# Patient Record
Sex: Male | Born: 1960 | Hispanic: No | Marital: Married | Smoking: Never smoker
Health system: Southern US, Community
[De-identification: ages and names within clinical notes are randomized; demographics above are authoritative.]

## PROBLEM LIST (undated history)

## (undated) DIAGNOSIS — N400 Enlarged prostate without lower urinary tract symptoms: Secondary | ICD-10-CM

## (undated) DIAGNOSIS — F419 Anxiety disorder, unspecified: Secondary | ICD-10-CM

## (undated) DIAGNOSIS — T7840XA Allergy, unspecified, initial encounter: Secondary | ICD-10-CM

## (undated) HISTORY — DX: Benign prostatic hyperplasia without lower urinary tract symptoms: N40.0

## (undated) HISTORY — DX: Allergy, unspecified, initial encounter: T78.40XA

## (undated) HISTORY — PX: COLONOSCOPY: SHX174

## (undated) HISTORY — PX: HERNIA REPAIR: SHX51

## (undated) HISTORY — DX: Anxiety disorder, unspecified: F41.9

---

## 2003-05-25 ENCOUNTER — Ambulatory Visit (HOSPITAL_COMMUNITY): Admission: RE | Admit: 2003-05-25 | Discharge: 2003-05-25 | Payer: Self-pay | Admitting: General Surgery

## 2013-05-31 LAB — HM COLONOSCOPY

## 2019-10-13 ENCOUNTER — Other Ambulatory Visit: Payer: Self-pay | Admitting: Physician Assistant

## 2019-10-13 MED ORDER — ALPRAZOLAM 0.5 MG PO TBDP: 0.5000 mg | ORAL_TABLET | Freq: Two times a day (BID) | ORAL | 0 refills | Status: DC | PRN

## 2019-11-07 ENCOUNTER — Ambulatory Visit (INDEPENDENT_AMBULATORY_CARE_PROVIDER_SITE_OTHER): Payer: BC Managed Care – PPO | Admitting: Physician Assistant

## 2019-11-07 ENCOUNTER — Encounter: Payer: Self-pay | Admitting: Physician Assistant

## 2019-11-07 ENCOUNTER — Other Ambulatory Visit: Payer: Self-pay

## 2019-11-07 VITALS — BP 138/80 | HR 82 | Temp 97.0°F | Resp 16 | Ht 70.0 in | Wt 198.0 lb

## 2019-11-07 DIAGNOSIS — Z0000 Encounter for general adult medical examination without abnormal findings: Secondary | ICD-10-CM | POA: Diagnosis not present

## 2019-11-07 DIAGNOSIS — Z23 Encounter for immunization: Secondary | ICD-10-CM

## 2019-11-07 NOTE — Assessment & Plan Note (Signed)
labwork pending Follow up annually and with Dr Liliane Shi as scheduled

## 2019-11-07 NOTE — Progress Notes (Signed)
Wellness physical  Subjective:    Patient ID: Dustin Horn, male    DOB: 04-15-1961, 59 y.o.   MRN: [id number]  Chief Complaint  Patient presents with  . Annual Exam    HPI Patient is in today for physical - pt voices no concerns or problems  .   Encounter for general adult medical examination without abnormal findings  Physical ("At Risk" items are starred): Patient's last physical exam was 1 year ago .  Weight: Appropriate for height  Blood Pressure: borderline Medical History: Patient history reviewed ; Family history reviewed ;  Allergies Reviewed: No change in current allergies ;  Medications Reviewed: Medications reviewed - no changes ;  Lipids: Normal lipid levels ;  Smoking: Life-long non-smoker ;   Alcohol/Drug Use: Is a non-drinker ; No illicit drug use ;  Patient is not afflicted from Stress Incontinence and Urge Incontinence  Safety: reviewed ; Patient wears a seat belt, has smoke detectors, has carbon monoxide detectors, practices appropriate gun safety, and wears sunscreen with extended sun exposure. Dental Care: biannual cleanings, brushes and flosses daily. Ophthalmology/Optometry: Annual visit.  Hearing loss: none Vision impairments: none  Past Medical History:  Diagnosis Date  . BPH (benign prostatic hyperplasia)     Past Surgical History:  Procedure Laterality Date  . HERNIA REPAIR      History reviewed. No pertinent family history.  Social History   Socioeconomic History  . Marital status: Married    Spouse name: Not on file  . Number of children: 3  . Years of education: Not on file  . Highest education level: Not on file  Occupational History  . Occupation: [employer]  Tobacco Use  . Smoking status: Never Smoker  . Smokeless tobacco: Never Used  Substance and Sexual Activity  . Alcohol use: Never  . Drug use: Never  . Sexual activity: Not on file  Other Topics Concern  . Not on file  Social History Narrative  . Not on file    Social Determinants of Health   Financial Resource Strain:   . Difficulty of Paying Living Expenses: Not on file  Food Insecurity:   . Worried About Charity fundraiser in the Last Year: Not on file  . Ran Out of Food in the Last Year: Not on file  Transportation Needs:   . Lack of Transportation (Medical): Not on file  . Lack of Transportation (Non-Medical): Not on file  Physical Activity:   . Days of Exercise per Week: Not on file  . Minutes of Exercise per Session: Not on file  Stress:   . Feeling of Stress : Not on file  Social Connections:   . Frequency of Communication with Friends and Family: Not on file  . Frequency of Social Gatherings with Friends and Family: Not on file  . Attends Religious Services: Not on file  . Active Member of Clubs or Organizations: Not on file  . Attends Archivist Meetings: Not on file  . Marital Status: Not on file  Intimate Partner Violence:   . Fear of Current or Ex-Partner: Not on file  . Emotionally Abused: Not on file  . Physically Abused: Not on file  . Sexually Abused: Not on file     Current Outpatient Medications:  .  ALPRAZolam (XANAX) 0.5 MG tablet, Take 0.5 mg by mouth 2 (two) times daily., Disp: , Rfl:  .  loratadine (CLARITIN) 10 MG tablet, Take 10 mg by mouth daily., Disp: , Rfl:  .  sildenafil (REVATIO) 20 MG tablet, Take 20 mg by mouth daily., Disp: , Rfl:  .  solifenacin (VESICARE) 10 MG tablet, Take 10 mg by mouth daily., Disp: , Rfl:  .  tamsulosin (FLOMAX) 0.4 MG CAPS capsule, Take 0.4 mg by mouth daily., Disp: , Rfl:    No Known Allergies  Review of Systems    CONSTITUTIONAL: Negative for chills, fatigue, fever, unintentional weight gain and unintentional weight loss.  E/N/T: Negative for ear pain, nasal congestion and sore throat.  CARDIOVASCULAR: Negative for chest pain, dizziness, palpitations and pedal edema.  RESPIRATORY: Negative for recent cough and dyspnea.  GASTROINTESTINAL: Negative for  abdominal pain, acid reflux symptoms, constipation, diarrhea, nausea and vomiting.  MSK: Negative for arthralgias and myalgias.  INTEGUMENTARY: Negative for rash.  NEUROLOGICAL: Negative for dizziness and headaches.  PSYCHIATRIC: Negative for sleep disturbance and to question depression screen.  Negative for depression, negative for anhedonia.      Objective:    Physical Exam PHYSICAL EXAM:   VS: BP 138/80   Pulse 82   Temp (!) 97 F (36.1 C)   Resp 16   Ht 5' 10"  (1.778 m)   Wt 198 lb (89.8 kg)   SpO2 97%   BMI 28.41 kg/m   GEN: Well nourished, well developed, in no acute distress  HEENT: normal external ears and nose - normal external auditory canals and TMS - hearing grossly normal - normal nasal mucosa and septum - Lips, Teeth and Gums - normal  Oropharynx - normal mucosa, palate, and posterior pharynx Neck: no JVD or masses - no thyromegaly Cardiac: RRR; no murmurs, rubs, or gallops,no edema - no significant varicosities Respiratory:  normal respiratory rate and pattern with no distress - normal breath sounds with no rales, rhonchi, wheezes or rubs GI: normal bowel sounds, no masses or tenderness GU - deferred to urologist MS: no deformity or atrophy  Skin: warm and dry, no rash  Neuro:  Alert and Oriented x 3, Strength and sensation are intact - CN II-Xii grossly intact Psych: euthymic mood, appropriate affect and demeanor   Wt Readings from Last 3 Encounters:  11/07/19 198 lb (89.8 kg)    Health Maintenance Due  Topic Date Due  . Hepatitis C Screening  03/23/1961  . HIV Screening  04/04/1974  . TETANUS/TDAP  04/04/1978    There are no preventive care reminders to display for this patient.   No results found for: TSH No results found for: WBC, HGB, HCT, MCV, PLT No results found for: NA, K, CHLORIDE, CO2, GLUCOSE, BUN, CREATININE, BILITOT, ALKPHOS, AST, ALT, PROT, ALBUMIN, CALCIUM, ANIONGAP, EGFR, GFR No results found for: CHOL No results found for:  HDL No results found for: LDLCALC No results found for: TRIG No results found for: CHOLHDL No results found for: HGBA1C     Assessment & Plan:   Problem List Items Addressed This Visit      Other   Routine general medical examination at a health care facility - Primary   Relevant Orders   CBC with Differential/Platelet   Comprehensive metabolic panel   TSH   Lipid panel   PSA   Need for vaccination   Relevant Orders   Tdap vaccine greater than or equal to 7yo IM       No orders of the defined types were placed in this encounter.    SARA R Albaro Deviney, PA-C

## 2019-11-09 LAB — COMPREHENSIVE METABOLIC PANEL
ALT: 22 IU/L (ref 0–44)
AST: 25 IU/L (ref 0–40)
Albumin/Globulin Ratio: 1.8 (ref 1.2–2.2)
Albumin: 4.3 g/dL (ref 3.8–4.9)
Alkaline Phosphatase: 68 IU/L (ref 39–117)
BUN/Creatinine Ratio: 20 (ref 10–24)
BUN: 20 mg/dL (ref 8–27)
Bilirubin Total: 0.6 mg/dL (ref 0.0–1.2)
CO2: 26 mmol/L (ref 20–29)
Calcium: 9.2 mg/dL (ref 8.6–10.2)
Chloride: 103 mmol/L (ref 96–106)
Creatinine, Ser: 0.98 mg/dL (ref 0.76–1.27)
GFR calc Af Amer: 96 mL/min/{1.73_m2} (ref >59)
GFR calc non Af Amer: 83 mL/min/{1.73_m2} (ref >59)
Globulin, Total: 2.4 g/dL (ref 1.5–4.5)
Glucose: 96 mg/dL (ref 65–99)
Potassium: 4.5 mmol/L (ref 3.5–5.2)
Sodium: 139 mmol/L (ref 134–144)
Total Protein: 6.7 g/dL (ref 6.0–8.5)

## 2019-11-09 LAB — LIPID PANEL
Chol/HDL Ratio: 3.6 ratio (ref 0.0–5.0)
Cholesterol, Total: 192 mg/dL (ref 100–199)
HDL: 53 mg/dL (ref >39)
LDL Chol Calc (NIH): 128 mg/dL — ABNORMAL HIGH (ref 0–99)
Triglycerides: 61 mg/dL (ref 0–149)
VLDL Cholesterol Cal: 11 mg/dL (ref 5–40)

## 2019-11-09 LAB — CBC WITH DIFFERENTIAL/PLATELET
Basophils Absolute: 0 10*3/uL (ref 0.0–0.2)
Basos: 1 %
EOS (ABSOLUTE): 0.1 10*3/uL (ref 0.0–0.4)
Eos: 3 %
Hematocrit: 47.1 % (ref 37.5–51.0)
Hemoglobin: 16.1 g/dL (ref 13.0–17.7)
Immature Grans (Abs): 0 10*3/uL (ref 0.0–0.1)
Immature Granulocytes: 0 %
Lymphocytes Absolute: 1.3 10*3/uL (ref 0.7–3.1)
Lymphs: 31 %
MCH: 30.6 pg (ref 26.6–33.0)
MCHC: 34.2 g/dL (ref 31.5–35.7)
MCV: 89 fL (ref 79–97)
Monocytes Absolute: 0.3 10*3/uL (ref 0.1–0.9)
Monocytes: 6 %
Neutrophils Absolute: 2.4 10*3/uL (ref 1.4–7.0)
Neutrophils: 59 %
Platelets: 235 10*3/uL (ref 150–450)
RBC: 5.27 x10E6/uL (ref 4.14–5.80)
RDW: 12 % (ref 11.6–15.4)
WBC: 4.1 10*3/uL (ref 3.4–10.8)

## 2019-11-09 LAB — PSA: Prostate Specific Ag, Serum: 3.6 ng/mL (ref 0.0–4.0)

## 2019-11-09 LAB — CARDIOVASCULAR RISK ASSESSMENT

## 2019-11-09 LAB — TSH: TSH: 1.55 u[IU]/mL (ref 0.450–4.500)

## 2019-11-11 ENCOUNTER — Ambulatory Visit: Payer: BC Managed Care – PPO | Attending: Internal Medicine

## 2019-11-11 DIAGNOSIS — Z23 Encounter for immunization: Secondary | ICD-10-CM

## 2019-11-11 NOTE — Progress Notes (Signed)
   Covid-19 Vaccination Clinic  Name:  Dustin Horn    MRN: [id number] DOB: Sep 07, 1960  11/11/2019  [name] was observed post Covid-19 immunization for 15 minutes without incident. He was provided with Vaccine Information Sheet and instruction to access the V-Safe system.   [name] was instructed to call 911 with any severe reactions post vaccine: Marland Kitchen Difficulty breathing  . Swelling of face and throat  . A fast heartbeat  . A bad rash all over body  . Dizziness and weakness   Immunizations Administered    Name Date Dose VIS Date Route   Pfizer COVID-19 Vaccine 11/11/2019 12:20 PM 0.3 mL 08/11/2019 Intramuscular   Manufacturer: [employer], [employer]   Lot: [id number]   NDC: [address]-9

## 2019-12-04 ENCOUNTER — Ambulatory Visit: Payer: BC Managed Care – PPO

## 2019-12-05 ENCOUNTER — Other Ambulatory Visit: Payer: Self-pay | Admitting: Physician Assistant

## 2019-12-05 ENCOUNTER — Ambulatory Visit: Payer: BC Managed Care – PPO | Attending: Internal Medicine

## 2019-12-05 DIAGNOSIS — Z23 Encounter for immunization: Secondary | ICD-10-CM

## 2019-12-05 NOTE — Progress Notes (Signed)
   Covid-19 Vaccination Clinic  Name:  Dustin Horn    MRN: [id number] DOB: October 14, 1960  12/05/2019  [name] was observed post Covid-19 immunization for 15 minutes without incident. He was provided with Vaccine Information Sheet and instruction to access the V-Safe system.   [name] was instructed to call 911 with any severe reactions post vaccine: Marland Kitchen Difficulty breathing  . Swelling of face and throat  . A fast heartbeat  . A bad rash all over body  . Dizziness and weakness   Immunizations Administered    Name Date Dose VIS Date Route   Pfizer COVID-19 Vaccine 12/05/2019 10:27 AM 0.3 mL 08/11/2019 Intramuscular   Manufacturer: [employer], [employer]   Lot: [id number]   NDC: [address]-4

## 2020-02-04 ENCOUNTER — Other Ambulatory Visit: Payer: Self-pay | Admitting: Physician Assistant

## 2020-04-07 ENCOUNTER — Other Ambulatory Visit: Payer: Self-pay | Admitting: Physician Assistant

## 2020-07-30 ENCOUNTER — Other Ambulatory Visit: Payer: Self-pay | Admitting: Family Medicine

## 2020-09-29 ENCOUNTER — Other Ambulatory Visit: Payer: Self-pay | Admitting: Family Medicine

## 2020-09-30 NOTE — Telephone Encounter (Signed)
Does pt get dissolvable xanax or regular? kc

## 2020-10-01 ENCOUNTER — Other Ambulatory Visit: Payer: Self-pay | Admitting: Physician Assistant

## 2020-10-01 MED ORDER — ALPRAZOLAM 0.5 MG PO TABS: 0.5000 mg | ORAL_TABLET | Freq: Two times a day (BID) | ORAL | 0 refills | Status: DC

## 2020-11-13 ENCOUNTER — Other Ambulatory Visit: Payer: Self-pay

## 2020-11-13 ENCOUNTER — Encounter: Payer: Self-pay | Admitting: Physician Assistant

## 2020-11-13 ENCOUNTER — Ambulatory Visit (INDEPENDENT_AMBULATORY_CARE_PROVIDER_SITE_OTHER): Payer: BC Managed Care – PPO

## 2020-11-13 VITALS — BP 124/78 | HR 87 | Temp 97.4°F | Ht 68.5 in | Wt 200.2 lb

## 2020-11-13 DIAGNOSIS — Z0000 Encounter for general adult medical examination without abnormal findings: Secondary | ICD-10-CM

## 2020-11-13 DIAGNOSIS — Z23 Encounter for immunization: Secondary | ICD-10-CM | POA: Diagnosis not present

## 2020-11-13 MED ORDER — ALPRAZOLAM 0.5 MG PO TABS: 0.5000 mg | ORAL_TABLET | Freq: Two times a day (BID) | ORAL | 0 refills | Status: DC

## 2020-11-13 NOTE — Patient Instructions (Signed)

## 2020-11-13 NOTE — Progress Notes (Signed)
Subjective:  Patient ID: Dustin Horn, male    DOB: Feb 21, 1961  Age: 60 y.o. MRN: [id number]  Chief Complaint  Patient presents with  . Annual Exam    HPI  Well Adult Physical: Patient here for a comprehensive physical exam.The patient reports no problems Do you take any herbs or supplements that were not prescribed by a doctor? no Are you taking calcium supplements? no Are you taking aspirin daily? no  Encounter for general adult medical examination without abnormal findings  Physical ("At Risk" items are starred): Patient's last physical exam was 1 year ago .  Smoking: Life-long non-smoker ;  Alcohol/Drug Use: Is a non-drinker ; No illicit drug use ;  Patient sees urology regularly - has appt tomorrow Dental Care: biannual cleanings, brushes and flosses daily. Ophthalmology/Optometry: Annual visit.  Hearing loss: none Vision impairments: glasses Last JSH:FWYO check today  Flowsheet Row Office Visit from 11/13/2020 in [facility]  PHQ-2 Total Score 0              Social History   Socioeconomic History  . Marital status: Married    Spouse name: Not on file  . Number of children: 3  . Years of education: Not on file  . Highest education level: Not on file  Occupational History  . Occupation: [employer]  Tobacco Use  . Smoking status: Never Smoker  . Smokeless tobacco: Never Used  Vaping Use  . Vaping Use: Never used  Substance and Sexual Activity  . Alcohol use: Never  . Drug use: Never  . Sexual activity: Not on file  Other Topics Concern  . Not on file  Social History Narrative  . Not on file   Social Determinants of Health   Financial Resource Strain: Not on file  Food Insecurity: Not on file  Transportation Needs: Not on file  Physical Activity: Not on file  Stress: Not on file  Social Connections: Not on file   Past Medical History:  Diagnosis Date  . BPH (benign prostatic hyperplasia)    Past Surgical History:  Procedure Laterality Date   . HERNIA REPAIR      History reviewed. No pertinent family history. Social History   Socioeconomic History  . Marital status: Married    Spouse name: Not on file  . Number of children: 3  . Years of education: Not on file  . Highest education level: Not on file  Occupational History  . Occupation: [employer]  Tobacco Use  . Smoking status: Never Smoker  . Smokeless tobacco: Never Used  Vaping Use  . Vaping Use: Never used  Substance and Sexual Activity  . Alcohol use: Never  . Drug use: Never  . Sexual activity: Not on file  Other Topics Concern  . Not on file  Social History Narrative  . Not on file   Social Determinants of Health   Financial Resource Strain: Not on file  Food Insecurity: Not on file  Transportation Needs: Not on file  Physical Activity: Not on file  Stress: Not on file  Social Connections: Not on file   Review of Systems CONSTITUTIONAL: Negative for chills, fatigue, fever, unintentional weight gain and unintentional weight loss.  E/N/T: Negative for ear pain, nasal congestion and sore throat.  CARDIOVASCULAR: Negative for chest pain, dizziness, palpitations and pedal edema.  RESPIRATORY: Negative for recent cough and dyspnea.  GASTROINTESTINAL: Negative for abdominal pain, acid reflux symptoms, constipation, diarrhea, nausea and vomiting.  MSK: Negative for arthralgias and myalgias.  INTEGUMENTARY: Negative for rash.  NEUROLOGICAL: Negative for dizziness and headaches.  PSYCHIATRIC: Negative for sleep disturbance and to question depression screen.  Negative for depression, negative for anhedonia.       Objective:  BP 124/78 (BP Location: Left Arm, Patient Position: Sitting, Cuff Size: Normal)   Pulse 87   Temp (!) 97.4 F (36.3 C) (Temporal)   Ht 5' 8.5" (1.74 m)   Wt 200 lb 3.2 oz (90.8 kg)   SpO2 98%   BMI 30.00 kg/m   BP/Weight 11/13/2020 11/07/2019  Systolic BP 124 138  Diastolic BP 78 80  Wt. (Lbs) 200.2 198  BMI 30 28.41    Physical  Exam PHYSICAL EXAM:   VS: BP 124/78 (BP Location: Left Arm, Patient Position: Sitting, Cuff Size: Normal)   Pulse 87   Temp (!) 97.4 F (36.3 C) (Temporal)   Ht 5' 8.5" (1.74 m)   Wt 200 lb 3.2 oz (90.8 kg)   SpO2 98%   BMI 30.00 kg/m   GEN: Well nourished, well developed, in no acute distress  HEENT: normal external ears and nose - normal external auditory canals and TMS - hearing grossly normal -- Lips, Teeth and Gums - normal  Oropharynx - normal mucosa, palate, and posterior pharynx Neck: no JVD or masses - no thyromegaly Cardiac: RRR; no murmurs, rubs, or gallops,no edema - Respiratory:  normal respiratory rate and pattern with no distress - normal breath sounds with no rales, rhonchi, wheezes or rubs GI: normal bowel sounds, no masses or tenderness MS: no deformity or atrophy  Skin: warm and dry, no rash  Neuro:  Alert and Oriented x 3, Strength and sensation are intact - CN II-Xii grossly intact Psych: euthymic mood, appropriate affect and demeanor  Lab Results  Component Value Date   WBC 4.1 11/07/2019   HGB 16.1 11/07/2019   HCT 47.1 11/07/2019   PLT 235 11/07/2019   GLUCOSE 96 11/07/2019   CHOL 192 11/07/2019   TRIG 61 11/07/2019   HDL 53 11/07/2019   LDLCALC 128 (H) 11/07/2019   ALT 22 11/07/2019   AST 25 11/07/2019   NA 139 11/07/2019   K 4.5 11/07/2019   CL 103 11/07/2019   CREATININE 0.98 11/07/2019   BUN 20 11/07/2019   CO2 26 11/07/2019   TSH 1.550 11/07/2019      Assessment & Plan:  1. Routine general medical examination at a health care facility - CBC with Differential/Platelet - Comprehensive metabolic panel - TSH - Lipid panel - PSA  2. Need for vaccination - Varicella-zoster vaccine IM (Shingrix)    Body mass index is 30 kg/m.   These are the goals we discussed: Goals   None      This is a list of the screening recommended for you and due dates:  Health Maintenance  Topic Date Due  .  Hepatitis C: One time screening is  recommended by Center for Disease Control  (CDC) for  adults born from [birth year] through 1967.   Never done  . HIV Screening  Never done  . Colon Cancer Screening  06/01/2023  . Tetanus Vaccine  11/06/2029  . Flu Shot  Completed  . COVID-19 Vaccine  Completed  . HPV Vaccine  Aged Out     AN INDIVIDUALIZED CARE PLAN: was established or reinforced today.   SELF MANAGEMENT: The patient and I together assessed ways to personally work towards obtaining the recommended goals  Support needs The patient and/or family needs were assessed and  services were offered if appropriate.  No orders of the defined types were placed in this encounter.    Follow-up: Return in about 1 year (around 11/13/2021) for fasting physical.  An After Visit Summary was printed and given to the patient.  Jettie Pagan [facility] [phone]

## 2020-11-14 ENCOUNTER — Other Ambulatory Visit: Payer: Self-pay | Admitting: Physician Assistant

## 2020-11-14 DIAGNOSIS — E7849 Other hyperlipidemia: Secondary | ICD-10-CM

## 2020-11-14 LAB — CBC WITH DIFFERENTIAL/PLATELET
Basophils Absolute: 0 10*3/uL (ref 0.0–0.2)
Basos: 0 %
EOS (ABSOLUTE): 0.1 10*3/uL (ref 0.0–0.4)
Eos: 3 %
Hematocrit: 46.1 % (ref 37.5–51.0)
Hemoglobin: 15.7 g/dL (ref 13.0–17.7)
Immature Grans (Abs): 0 10*3/uL (ref 0.0–0.1)
Immature Granulocytes: 0 %
Lymphocytes Absolute: 1.5 10*3/uL (ref 0.7–3.1)
Lymphs: 31 %
MCH: 30.8 pg (ref 26.6–33.0)
MCHC: 34.1 g/dL (ref 31.5–35.7)
MCV: 90 fL (ref 79–97)
Monocytes Absolute: 0.3 10*3/uL (ref 0.1–0.9)
Monocytes: 7 %
Neutrophils Absolute: 2.8 10*3/uL (ref 1.4–7.0)
Neutrophils: 59 %
Platelets: 228 10*3/uL (ref 150–450)
RBC: 5.1 x10E6/uL (ref 4.14–5.80)
RDW: 11.8 % (ref 11.6–15.4)
WBC: 4.7 10*3/uL (ref 3.4–10.8)

## 2020-11-14 LAB — LIPID PANEL
Chol/HDL Ratio: 3.9 ratio (ref 0.0–5.0)
Cholesterol, Total: 194 mg/dL (ref 100–199)
HDL: 50 mg/dL (ref >39)
LDL Chol Calc (NIH): 130 mg/dL — ABNORMAL HIGH (ref 0–99)
Triglycerides: 78 mg/dL (ref 0–149)
VLDL Cholesterol Cal: 14 mg/dL (ref 5–40)

## 2020-11-14 LAB — COMPREHENSIVE METABOLIC PANEL
ALT: 19 IU/L (ref 0–44)
AST: 25 IU/L (ref 0–40)
Albumin/Globulin Ratio: 1.5 (ref 1.2–2.2)
Albumin: 4.3 g/dL (ref 3.8–4.8)
Alkaline Phosphatase: 78 IU/L (ref 44–121)
BUN/Creatinine Ratio: 20 (ref 10–24)
BUN: 18 mg/dL (ref 8–27)
Bilirubin Total: 0.6 mg/dL (ref 0.0–1.2)
CO2: 21 mmol/L (ref 20–29)
Calcium: 9.3 mg/dL (ref 8.6–10.2)
Chloride: 98 mmol/L (ref 96–106)
Creatinine, Ser: 0.92 mg/dL (ref 0.76–1.27)
Globulin, Total: 2.9 g/dL (ref 1.5–4.5)
Glucose: 94 mg/dL (ref 65–99)
Potassium: 4.3 mmol/L (ref 3.5–5.2)
Sodium: 136 mmol/L (ref 134–144)
Total Protein: 7.2 g/dL (ref 6.0–8.5)
eGFR: 95 mL/min/{1.73_m2} (ref >59)

## 2020-11-14 LAB — PSA: Prostate Specific Ag, Serum: 6.2 ng/mL — ABNORMAL HIGH (ref 0.0–4.0)

## 2020-11-14 LAB — TSH: TSH: 1.27 u[IU]/mL (ref 0.450–4.500)

## 2020-11-14 LAB — CARDIOVASCULAR RISK ASSESSMENT

## 2020-11-14 MED ORDER — ROSUVASTATIN CALCIUM 5 MG PO TABS: 5.0000 mg | ORAL_TABLET | Freq: Every day | ORAL | 0 refills | Status: DC

## 2020-11-15 ENCOUNTER — Other Ambulatory Visit: Payer: Self-pay | Admitting: Urology

## 2020-11-15 DIAGNOSIS — R972 Elevated prostate specific antigen [PSA]: Secondary | ICD-10-CM

## 2020-12-11 ENCOUNTER — Ambulatory Visit
Admission: RE | Admit: 2020-12-11 | Discharge: 2020-12-11 | Disposition: A | Discharge status: Home / Self Care | Payer: BC Managed Care – PPO | Source: Ambulatory Visit | Attending: Urology | Admitting: Urology

## 2020-12-11 ENCOUNTER — Other Ambulatory Visit: Payer: Self-pay

## 2020-12-11 DIAGNOSIS — R972 Elevated prostate specific antigen [PSA]: Secondary | ICD-10-CM

## 2020-12-11 MED ORDER — GADOBENATE DIMEGLUMINE 529 MG/ML IV SOLN
18.0000 mL | Freq: Once | INTRAVENOUS | Status: AC | PRN
  Administered 2020-12-11: 18 mL via INTRAVENOUS

## 2021-01-13 ENCOUNTER — Other Ambulatory Visit: Payer: Self-pay | Admitting: Physician Assistant

## 2021-02-03 ENCOUNTER — Ambulatory Visit: Payer: BC Managed Care – PPO

## 2021-02-06 ENCOUNTER — Ambulatory Visit: Payer: BC Managed Care – PPO

## 2021-02-20 ENCOUNTER — Ambulatory Visit (INDEPENDENT_AMBULATORY_CARE_PROVIDER_SITE_OTHER): Payer: BC Managed Care – PPO

## 2021-02-20 ENCOUNTER — Other Ambulatory Visit: Payer: Self-pay

## 2021-02-20 DIAGNOSIS — Z23 Encounter for immunization: Secondary | ICD-10-CM

## 2021-02-20 DIAGNOSIS — E7849 Other hyperlipidemia: Secondary | ICD-10-CM

## 2021-02-20 NOTE — Progress Notes (Signed)
   Shingrix Vaccine, (second dose) given per order, patient tolerated well.   Jacklynn Bue, LPN 1:61 AM

## 2021-02-21 ENCOUNTER — Other Ambulatory Visit: Payer: Self-pay | Admitting: Physician Assistant

## 2021-02-21 DIAGNOSIS — E7849 Other hyperlipidemia: Secondary | ICD-10-CM

## 2021-02-21 LAB — LIPID PANEL
Chol/HDL Ratio: 2.6 ratio (ref 0.0–5.0)
Cholesterol, Total: 131 mg/dL (ref 100–199)
HDL: 51 mg/dL (ref >39)
LDL Chol Calc (NIH): 68 mg/dL (ref 0–99)
Triglycerides: 57 mg/dL (ref 0–149)
VLDL Cholesterol Cal: 12 mg/dL (ref 5–40)

## 2021-02-21 LAB — COMPREHENSIVE METABOLIC PANEL
ALT: 25 IU/L (ref 0–44)
AST: 23 IU/L (ref 0–40)
Albumin/Globulin Ratio: 1.6 (ref 1.2–2.2)
Albumin: 4.3 g/dL (ref 3.8–4.8)
Alkaline Phosphatase: 79 IU/L (ref 44–121)
BUN/Creatinine Ratio: 19 (ref 10–24)
BUN: 17 mg/dL (ref 8–27)
Bilirubin Total: 0.7 mg/dL (ref 0.0–1.2)
CO2: 23 mmol/L (ref 20–29)
Calcium: 9.2 mg/dL (ref 8.6–10.2)
Chloride: 104 mmol/L (ref 96–106)
Creatinine, Ser: 0.9 mg/dL (ref 0.76–1.27)
Globulin, Total: 2.7 g/dL (ref 1.5–4.5)
Glucose: 87 mg/dL (ref 65–99)
Potassium: 4.4 mmol/L (ref 3.5–5.2)
Sodium: 141 mmol/L (ref 134–144)
Total Protein: 7 g/dL (ref 6.0–8.5)
eGFR: 97 mL/min/{1.73_m2} (ref >59)

## 2021-02-21 LAB — CARDIOVASCULAR RISK ASSESSMENT

## 2021-06-23 ENCOUNTER — Other Ambulatory Visit: Payer: Self-pay | Admitting: Family Medicine

## 2021-06-23 ENCOUNTER — Other Ambulatory Visit: Payer: Self-pay | Admitting: Physician Assistant

## 2021-06-23 DIAGNOSIS — E7849 Other hyperlipidemia: Secondary | ICD-10-CM

## 2021-08-17 ENCOUNTER — Other Ambulatory Visit: Payer: Self-pay | Admitting: Physician Assistant

## 2021-10-12 ENCOUNTER — Other Ambulatory Visit: Payer: Self-pay | Admitting: Physician Assistant

## 2021-10-12 DIAGNOSIS — E7849 Other hyperlipidemia: Secondary | ICD-10-CM

## 2021-11-17 ENCOUNTER — Ambulatory Visit (INDEPENDENT_AMBULATORY_CARE_PROVIDER_SITE_OTHER): Payer: BC Managed Care – PPO | Admitting: Physician Assistant

## 2021-11-17 ENCOUNTER — Encounter: Payer: Self-pay | Admitting: Physician Assistant

## 2021-11-17 ENCOUNTER — Other Ambulatory Visit: Payer: Self-pay

## 2021-11-17 VITALS — BP 128/68 | HR 76 | Temp 97.0°F | Ht 70.0 in | Wt 193.0 lb

## 2021-11-17 DIAGNOSIS — R5383 Other fatigue: Secondary | ICD-10-CM

## 2021-11-17 DIAGNOSIS — Z0000 Encounter for general adult medical examination without abnormal findings: Secondary | ICD-10-CM | POA: Diagnosis not present

## 2021-11-17 LAB — POCT URINALYSIS DIP (CLINITEK)
Bilirubin, UA: NEGATIVE
Blood, UA: NEGATIVE
Glucose, UA: NEGATIVE mg/dL
Ketones, POC UA: NEGATIVE mg/dL
Leukocytes, UA: NEGATIVE
Nitrite, UA: NEGATIVE
Spec Grav, UA: 1.015 (ref 1.010–1.025)
Urobilinogen, UA: 0.2 E.U./dL
pH, UA: 6.5 (ref 5.0–8.0)

## 2021-11-17 NOTE — Progress Notes (Signed)
? ?Subjective:  ?Patient ID: Dustin Horn, male    DOB: June 15, 1961  Age: 61 y.o. MRN: [id number] ? ?Chief Complaint  ?Patient presents with  ? Annual Exam  ? ? ?HPI  ?Well Adult Physical: Patient here for a comprehensive physical exam.The patient reports  no problems except that he would like testosterone level checked - the labwork will be sent to his urologist at [facility] - Dr Liliane Shi ?Do you take any herbs or supplements that were not prescribed by a doctor? no Are you taking calcium supplements? no Are you taking aspirin daily? no ? ?Encounter for general adult medical examination without abnormal findings  ?Physical ("At Risk" items are starred): Patient's last physical exam was 1 year ago .  ?Patient wears a seat belt, has smoke detectors, has carbon monoxide detectors, practices appropriate gun safety, and wears sunscreen with extended sun exposure. ?Dental Care: biannual cleanings, brushes and flosses daily. ?Ophthalmology/Optometry: Annual visit.  ?Hearing loss: none ?Vision impairments: wears glasses ?Last PSA:one year ago - slightly elevated - sees urology ? ?Flowsheet Row Office Visit from 11/17/2021 in [facility]  ?PHQ-2 Total Score 0  ? ?  ?  ?  ?   ?   ?Social History  ? ?Socioeconomic History  ? Marital status: Married  ?  Spouse name: Not on file  ? Number of children: 3  ? Years of education: Not on file  ? Highest education level: Not on file  ?Occupational History  ? Occupation: [employer]  ?Tobacco Use  ? Smoking status: Never  ? Smokeless tobacco: Never  ?Vaping Use  ? Vaping Use: Never used  ?Substance and Sexual Activity  ? Alcohol use: Never  ? Drug use: Never  ? Sexual activity: Not on file  ?Other Topics Concern  ? Not on file  ?Social History Narrative  ? Not on file  ? ?Social Determinants of Health  ? ?Financial Resource Strain: Not on file  ?Food Insecurity: Not on file  ?Transportation Needs: Not on file  ?Physical Activity: Not on file  ?Stress: Not on file  ?Social Connections:  Not on file  ? ?Past Medical History:  ?Diagnosis Date  ? BPH (benign prostatic hyperplasia)   ? ?Past Surgical History:  ?Procedure Laterality Date  ? HERNIA REPAIR    ?  ?History reviewed. No pertinent family history. ?Social History  ? ?Socioeconomic History  ? Marital status: Married  ?  Spouse name: Not on file  ? Number of children: 3  ? Years of education: Not on file  ? Highest education level: Not on file  ?Occupational History  ? Occupation: [employer]  ?Tobacco Use  ? Smoking status: Never  ? Smokeless tobacco: Never  ?Vaping Use  ? Vaping Use: Never used  ?Substance and Sexual Activity  ? Alcohol use: Never  ? Drug use: Never  ? Sexual activity: Not on file  ?Other Topics Concern  ? Not on file  ?Social History Narrative  ? Not on file  ? ?Social Determinants of Health  ? ?Financial Resource Strain: Not on file  ?Food Insecurity: Not on file  ?Transportation Needs: Not on file  ?Physical Activity: Not on file  ?Stress: Not on file  ?Social Connections: Not on file  ? ?Review of Systems ?CONSTITUTIONAL: Negative for chills, fatigue, fever, unintentional weight gain and unintentional weight loss.  ?E/N/T: Negative for ear pain, nasal congestion and sore throat.  ?CARDIOVASCULAR: Negative for chest pain, dizziness, palpitations and pedal edema.  ?RESPIRATORY: Negative for recent  cough and dyspnea.  ?GASTROINTESTINAL: Negative for abdominal pain, acid reflux symptoms, constipation, diarrhea, nausea and vomiting.  ?MSK: Negative for arthralgias and myalgias.  ?INTEGUMENTARY: Negative for rash.  ?NEUROLOGICAL: Negative for dizziness and headaches.  ?PSYCHIATRIC: Negative for sleep disturbance and to question depression screen.  Negative for depression, negative for anhedonia.  ?   ? ? ?Objective:  ?BP 128/68   Pulse 76   Temp (!) 97 ?F (36.1 ?C)   Ht 5\' 10"  (1.778 m)   Wt 193 lb (87.5 kg)   SpO2 100%   BMI 27.69 kg/m?  ? ?BP/Weight 11/17/2021 11/13/2020 11/07/2019  ?Systolic BP 128 124 138  ?Diastolic BP 68 78 80   ?Wt. (Lbs) 193 200.2 198  ?BMI 27.69 30 28.41  ? ? ?Physical Exam ?PHYSICAL EXAM:  ? ?VS: BP 128/68   Pulse 76   Temp (!) 97 ?F (36.1 ?C)   Ht 5\' 10"  (1.778 m)   Wt 193 lb (87.5 kg)   SpO2 100%   BMI 27.69 kg/m?  ? ?GEN: Well nourished, well developed, in no acute distress  ?HEENT: normal external ears and nose - normal external auditory canals and TMS - hearing grossly normal - normal nasal mucosa and septum - Lips, Teeth and Gums - normal  ?Oropharynx - normal mucosa, palate, and posterior pharynx ?Neck: no JVD or masses - no thyromegaly ?Cardiac: RRR; no murmurs, rubs, or gallops,no edema -  ?Respiratory:  normal respiratory rate and pattern with no distress - normal breath sounds with no rales, rhonchi, wheezes or rubs ?GI: normal bowel sounds, no masses or tenderness ?MS: no deformity or atrophy  ?Skin: warm and dry, no rash  ?Neuro:  Alert and Oriented x 3, Strength and sensation are intact - CN II-Xii grossly intact ?Psych: euthymic mood, appropriate affect and demeanor ? ?Lab Results  ?Component Value Date  ? WBC 4.7 11/13/2020  ? HGB 15.7 11/13/2020  ? HCT 46.1 11/13/2020  ? PLT 228 11/13/2020  ? GLUCOSE 87 02/20/2021  ? CHOL 131 02/20/2021  ? TRIG 57 02/20/2021  ? HDL 51 02/20/2021  ? LDLCALC 68 02/20/2021  ? ALT 25 02/20/2021  ? AST 23 02/20/2021  ? NA 141 02/20/2021  ? K 4.4 02/20/2021  ? CL 104 02/20/2021  ? CREATININE 0.90 02/20/2021  ? BUN 17 02/20/2021  ? CO2 23 02/20/2021  ? TSH 1.270 11/13/2020  ? ? ? ? ?Assessment & Plan:  ? ?Problem List Items Addressed This Visit   ?None ?Visit Diagnoses   ? ? Routine physical examination    -  Primary  ? Relevant Orders  ? CBC with Differential/Platelet  ? Comprehensive metabolic panel  ? TSH  ? Lipid panel  ? Testosterone,Free and Total  ? PSA  ? POCT URINALYSIS DIP (CLINITEK) (Completed)  ? Other fatigue      ? Relevant Orders  ? Testosterone,Free and Total  ? ?  ? ? ?Body mass index is 27.69 kg/m?.  ? ?These are the goals we discussed: ? Goals    ?None ?  ?  ? ?This is a list of the screening recommended for you and due dates:  ?Health Maintenance  ?Topic Date Due  ? COVID-19 Vaccine (4 - Booster for Pfizer series) 12/03/2021*  ? Colon Cancer Screening  06/01/2023  ? Tetanus Vaccine  11/06/2029  ? Flu Shot  Completed  ? Zoster (Shingles) Vaccine  Completed  ? HPV Vaccine  Aged Out  ? Hepatitis C Screening: USPSTF Recommendation to screen -  Ages 5218-79 yo.  Discontinued  ? HIV Screening  Discontinued  ?*Topic was postponed. The date shown is not the original due date.  ?  ? ?No orders of the defined types were placed in this encounter. ? ? ? ?Follow-up: Return in about 1 year (around 11/18/2022) for fasting physical. ? ?An After Visit Summary was printed and given to the patient. ? ?SARA R Thierno Hun, PA-C ?[facility] ?[phone] ? ?

## 2021-11-20 ENCOUNTER — Encounter: Payer: Self-pay | Admitting: Physician Assistant

## 2021-11-22 LAB — TESTOSTERONE,FREE AND TOTAL
Testosterone, Free: 6.5 pg/mL — ABNORMAL LOW (ref 6.6–18.1)
Testosterone: 564 ng/dL (ref 264–916)

## 2021-11-22 LAB — CBC WITH DIFFERENTIAL/PLATELET
Basophils Absolute: 0 10*3/uL (ref 0.0–0.2)
Basos: 1 %
EOS (ABSOLUTE): 0.2 10*3/uL (ref 0.0–0.4)
Eos: 3 %
Hematocrit: 47 % (ref 37.5–51.0)
Hemoglobin: 16 g/dL (ref 13.0–17.7)
Immature Grans (Abs): 0 10*3/uL (ref 0.0–0.1)
Immature Granulocytes: 0 %
Lymphocytes Absolute: 1.6 10*3/uL (ref 0.7–3.1)
Lymphs: 35 %
MCH: 30.7 pg (ref 26.6–33.0)
MCHC: 34 g/dL (ref 31.5–35.7)
MCV: 90 fL (ref 79–97)
Monocytes Absolute: 0.3 10*3/uL (ref 0.1–0.9)
Monocytes: 7 %
Neutrophils Absolute: 2.5 10*3/uL (ref 1.4–7.0)
Neutrophils: 54 %
Platelets: 253 10*3/uL (ref 150–450)
RBC: 5.22 x10E6/uL (ref 4.14–5.80)
RDW: 12.2 % (ref 11.6–15.4)
WBC: 4.6 10*3/uL (ref 3.4–10.8)

## 2021-11-22 LAB — COMPREHENSIVE METABOLIC PANEL
ALT: 48 IU/L — ABNORMAL HIGH (ref 0–44)
AST: 36 IU/L (ref 0–40)
Albumin/Globulin Ratio: 2 (ref 1.2–2.2)
Albumin: 4.6 g/dL (ref 3.8–4.8)
Alkaline Phosphatase: 68 IU/L (ref 44–121)
BUN/Creatinine Ratio: 19 (ref 10–24)
BUN: 20 mg/dL (ref 8–27)
Bilirubin Total: 0.6 mg/dL (ref 0.0–1.2)
CO2: 26 mmol/L (ref 20–29)
Calcium: 9.4 mg/dL (ref 8.6–10.2)
Chloride: 99 mmol/L (ref 96–106)
Creatinine, Ser: 1.03 mg/dL (ref 0.76–1.27)
Globulin, Total: 2.3 g/dL (ref 1.5–4.5)
Glucose: 102 mg/dL — ABNORMAL HIGH (ref 70–99)
Potassium: 4.8 mmol/L (ref 3.5–5.2)
Sodium: 137 mmol/L (ref 134–144)
Total Protein: 6.9 g/dL (ref 6.0–8.5)
eGFR: 82 mL/min/{1.73_m2} (ref >59)

## 2021-11-22 LAB — PSA: Prostate Specific Ag, Serum: 1.4 ng/mL (ref 0.0–4.0)

## 2021-11-22 LAB — LIPID PANEL
Chol/HDL Ratio: 3 ratio (ref 0.0–5.0)
Cholesterol, Total: 136 mg/dL (ref 100–199)
HDL: 45 mg/dL (ref >39)
LDL Chol Calc (NIH): 79 mg/dL (ref 0–99)
Triglycerides: 55 mg/dL (ref 0–149)
VLDL Cholesterol Cal: 12 mg/dL (ref 5–40)

## 2021-11-22 LAB — TSH: TSH: 1.37 u[IU]/mL (ref 0.450–4.500)

## 2021-11-22 LAB — CARDIOVASCULAR RISK ASSESSMENT

## 2021-12-07 ENCOUNTER — Other Ambulatory Visit: Payer: Self-pay | Admitting: Physician Assistant

## 2022-01-27 ENCOUNTER — Other Ambulatory Visit: Payer: Self-pay | Admitting: Physician Assistant

## 2022-01-27 DIAGNOSIS — E7849 Other hyperlipidemia: Secondary | ICD-10-CM

## 2022-02-01 ENCOUNTER — Other Ambulatory Visit: Payer: Self-pay | Admitting: Physician Assistant

## 2022-03-29 ENCOUNTER — Other Ambulatory Visit: Payer: Self-pay | Admitting: Physician Assistant

## 2022-04-20 ENCOUNTER — Other Ambulatory Visit: Payer: Self-pay | Admitting: Physician Assistant

## 2022-04-20 DIAGNOSIS — E7849 Other hyperlipidemia: Secondary | ICD-10-CM

## 2022-05-27 ENCOUNTER — Other Ambulatory Visit: Payer: Self-pay | Admitting: Physician Assistant

## 2022-07-13 ENCOUNTER — Telehealth: Payer: Self-pay

## 2022-07-13 NOTE — Telephone Encounter (Signed)
Mrs. Vandervelden called to report that [facility] tested positive for covid last pm.  Today he is experiencing cough, sore throat, fever and bodyaches.  Virtual appointment scheduled for tomorrow morning.

## 2022-07-14 ENCOUNTER — Telehealth (INDEPENDENT_AMBULATORY_CARE_PROVIDER_SITE_OTHER): Payer: BC Managed Care – PPO | Admitting: Physician Assistant

## 2022-07-14 ENCOUNTER — Encounter: Payer: Self-pay | Admitting: Physician Assistant

## 2022-07-14 VITALS — Temp 100.1°F | Ht 70.0 in | Wt 194.0 lb

## 2022-07-14 DIAGNOSIS — U071 COVID-19: Secondary | ICD-10-CM

## 2022-07-14 MED ORDER — BENZONATATE 100 MG PO CAPS: 100.0000 mg | ORAL_CAPSULE | Freq: Two times a day (BID) | ORAL | 1 refills | Status: DC | PRN

## 2022-07-14 MED ORDER — MOLNUPIRAVIR EUA 200MG CAPSULE: 4.0000 | ORAL_CAPSULE | Freq: Two times a day (BID) | ORAL | 0 refills | Status: AC

## 2022-07-14 NOTE — Progress Notes (Signed)
Virtual Visit via Telephone Note   This visit type was conducted due to national recommendations for restrictions regarding the COVID-19 Pandemic (e.g. social distancing) in an effort to limit this patient's exposure and mitigate transmission in our community.  Due to his co-morbid illnesses, this patient is at least at moderate risk for complications without adequate follow up.  This format is felt to be most appropriate for this patient at this time.  The patient did not have access to video technology/had technical difficulties with video requiring transitioning to audio format only (telephone).  All issues noted in this document were discussed and addressed.  No physical exam could be performed with this format.  Patient verbally consented to a telehealth visit.   Date:  07/14/2022   ID:  JORRELL KUSTER, DOB 09/20/1960, MRN [id number]  Patient Location: Home Provider Location: Office  PCP:  Marianne Sofia, PA-C     Chief Complaint:  COVID  History of Present Illness:    HILTON SAEPHAN is a 61 y.o. male with complaints of cough, malaise and fever that started Sunday night - fever up to 101 and does have body aches.  He did home COVID test which was positive - denies chest pain or dyspnea  The patient does have symptoms concerning for COVID-19 infection (fever, chills, cough, or new shortness of breath).    Past Medical History:  Diagnosis Date   BPH (benign prostatic hyperplasia)    Past Surgical History:  Procedure Laterality Date   HERNIA REPAIR       Current Meds  Medication Sig   ALPRAZolam (XANAX) 0.5 MG tablet Take 1 tablet by mouth twice daily   benzonatate (TESSALON) 100 MG capsule Take 1 capsule (100 mg total) by mouth 2 (two) times daily as needed for cough.   finasteride (PROSCAR) 5 MG tablet Take 5 mg by mouth daily.   loratadine (CLARITIN) 10 MG tablet Take 10 mg by mouth daily.   molnupiravir EUA (LAGEVRIO) 200 mg CAPS capsule Take 4 capsules (800 mg total)  by mouth 2 (two) times daily for 5 days.   rosuvastatin (CRESTOR) 5 MG tablet Take 1 tablet by mouth once daily   sildenafil (REVATIO) 20 MG tablet Take 20 mg by mouth daily.   tamsulosin (FLOMAX) 0.4 MG CAPS capsule Take 0.4 mg by mouth daily.     Allergies:   Patient has no known allergies.   Social History   Tobacco Use   Smoking status: Never   Smokeless tobacco: Never  Vaping Use   Vaping Use: Never used  Substance Use Topics   Alcohol use: Never   Drug use: Never     Family Hx: The patient's family history is not on file.  ROS:   Please see the history of present illness.    All other systems reviewed and are negative.  Labs/Other Tests and Data Reviewed:    Recent Labs: 11/17/2021: ALT 48; BUN 20; Creatinine, Ser 1.03; Hemoglobin 16.0; Platelets 253; Potassium 4.8; Sodium 137; TSH 1.370   Recent Lipid Panel Lab Results  Component Value Date/Time   CHOL 136 11/17/2021 08:48 AM   TRIG 55 11/17/2021 08:48 AM   HDL 45 11/17/2021 08:48 AM   CHOLHDL 3.0 11/17/2021 08:48 AM   LDLCALC 79 11/17/2021 08:48 AM    Wt Readings from Last 3 Encounters:  07/14/22 194 lb (88 kg)  11/17/21 193 lb (87.5 kg)  11/13/20 200 lb 3.2 oz (90.8 kg)     Objective:  Vital Signs:  Temp 100.1 F (37.8 C)   Ht 5\' 10"  (1.778 m)   Wt 194 lb (88 kg)   BMI 27.84 kg/m    VITAL SIGNS:  reviewed GEN:  no acute distress  ASSESSMENT & PLAN:    COVID 19 - rx for molnupiravir and tessalon perles to take as directed - recommend rest, fluids, tylenol for fever and hold cholesterol medication while on treatment Quarantine for total of 5 days then mask for 5 days  COVID-19 Education: The signs and symptoms of COVID-19 were discussed with the patient and how to seek care for testing (follow up with PCP or arrange E-visit). The importance of social distancing was discussed today.  Time:   Today, I have spent 10 minutes with the patient with telehealth technology discussing the above  problems.     Medication Adjustments/Labs and Tests Ordered: Current medicines are reviewed at length with the patient today.  Concerns regarding medicines are outlined above.   Tests Ordered: No orders of the defined types were placed in this encounter.   Medication Changes: Meds ordered this encounter  Medications   molnupiravir EUA (LAGEVRIO) 200 mg CAPS capsule    Sig: Take 4 capsules (800 mg total) by mouth 2 (two) times daily for 5 days.    Dispense:  40 capsule    Refill:  0    Order Specific Question:   Supervising Provider    Answer:     benzonatate (TESSALON) 100 MG capsule    Sig: Take 1 capsule (100 mg total) by mouth 2 (two) times daily as needed for cough.    Dispense:  20 capsule    Refill:  1    Order Specific Question:   Supervising Provider    AnswerCorey Harold    Follow Up:  In Person prn  Signed, Corey Harold  07/14/2022 8:37 AM    [facility]

## 2022-07-26 ENCOUNTER — Other Ambulatory Visit: Payer: Self-pay | Admitting: Physician Assistant

## 2022-08-31 ENCOUNTER — Other Ambulatory Visit: Payer: Self-pay | Admitting: Physician Assistant

## 2022-08-31 DIAGNOSIS — E7849 Other hyperlipidemia: Secondary | ICD-10-CM

## 2022-09-21 ENCOUNTER — Other Ambulatory Visit: Payer: Self-pay | Admitting: Physician Assistant

## 2022-11-15 ENCOUNTER — Other Ambulatory Visit: Payer: Self-pay | Admitting: Physician Assistant

## 2022-12-01 ENCOUNTER — Ambulatory Visit (INDEPENDENT_AMBULATORY_CARE_PROVIDER_SITE_OTHER): Payer: BC Managed Care – PPO | Admitting: Physician Assistant

## 2022-12-01 ENCOUNTER — Encounter: Payer: Self-pay | Admitting: Physician Assistant

## 2022-12-01 VITALS — BP 120/70 | HR 76 | Temp 97.0°F | Ht 67.5 in | Wt 197.8 lb

## 2022-12-01 DIAGNOSIS — Z0000 Encounter for general adult medical examination without abnormal findings: Secondary | ICD-10-CM | POA: Diagnosis not present

## 2022-12-01 DIAGNOSIS — E291 Testicular hypofunction: Secondary | ICD-10-CM

## 2022-12-01 DIAGNOSIS — R739 Hyperglycemia, unspecified: Secondary | ICD-10-CM

## 2022-12-01 DIAGNOSIS — R3129 Other microscopic hematuria: Secondary | ICD-10-CM

## 2022-12-01 DIAGNOSIS — N400 Enlarged prostate without lower urinary tract symptoms: Secondary | ICD-10-CM

## 2022-12-01 LAB — POCT URINALYSIS DIP (CLINITEK)
Bilirubin, UA: NEGATIVE
Glucose, UA: NEGATIVE mg/dL
Ketones, POC UA: NEGATIVE mg/dL
Nitrite, UA: NEGATIVE
Spec Grav, UA: 1.01 (ref 1.010–1.025)
Urobilinogen, UA: 0.2 E.U./dL
pH, UA: 8.5 — AB (ref 5.0–8.0)

## 2022-12-01 NOTE — Progress Notes (Signed)
Subjective:  Patient ID: Dustin Horn, male    DOB: 03-31-1961  Age: 62 y.o. MRN: [id number]  Chief Complaint  Patient presents with   Annual Exam    HPI  Well Adult Physical: Patient here for a comprehensive physical exam.The patient reports no problems - does follow yearly with urologist for BPH Do you take any herbs or supplements that were not prescribed by a doctor? no Are you taking calcium supplements? no Are you taking aspirin daily? no  Encounter for general adult medical examination without abnormal findings  Physical ("At Risk" items are starred): Patient's last physical exam was 1 year ago .  Patient wears a seat belt, has smoke detectors, has carbon monoxide detectors, practices appropriate gun safety, and wears sunscreen with extended sun exposure. Dental Care: biannual cleanings, brushes and flosses daily. Ophthalmology/Optometry: Annual visit.  Hearing loss: none Vision impairments: wears glasses Last PSA: one year ago     12/01/2022    8:45 AM 11/17/2021    8:16 AM 11/13/2020    8:30 AM  Depression screen PHQ 2/9  Decreased Interest 0 0 0  Down, Depressed, Hopeless 0 0 0  PHQ - 2 Score 0 0 0  Altered sleeping 0    Tired, decreased energy 0    Change in appetite 0    Feeling bad or failure about yourself  0    Trouble concentrating 0    Moving slowly or fidgety/restless 0    Suicidal thoughts 0    PHQ-9 Score 0    Difficult doing work/chores Not difficult at all           11/17/2021    8:16 AM 12/01/2022    8:42 AM  Fall Risk  Falls in the past year? 0 0  Was there an injury with Fall? 0 0  Fall Risk Category Calculator 0 0  Fall Risk Category (Retired) Low   (RETIRED) Patient Fall Risk Level Low fall risk   Patient at Risk for Falls Due to No Fall Risks No Fall Risks  Fall risk Follow up Falls evaluation completed Falls evaluation completed              Past Medical History:  Diagnosis Date   BPH (benign prostatic hyperplasia)    Past  Surgical History:  Procedure Laterality Date   HERNIA REPAIR      History reviewed. No pertinent family history. Social History   Socioeconomic History   Marital status: Married    Spouse name: Not on file   Number of children: 3   Years of education: Not on file   Highest education level: Not on file  Occupational History   Occupation: [employer]  Tobacco Use   Smoking status: Never   Smokeless tobacco: Never  Vaping Use   Vaping Use: Never used  Substance and Sexual Activity   Alcohol use: Never   Drug use: Never   Sexual activity: Not on file  Other Topics Concern   Not on file  Social History Narrative   Not on file   Social Determinants of Health   Financial Resource Strain: Low Risk  (12/01/2022)   Overall Financial Resource Strain (CARDIA)    Difficulty of Paying Living Expenses: Not hard at all  Food Insecurity: No Food Insecurity (12/01/2022)   Hunger Vital Sign    Worried About Running Out of Food in the Last Year: Never true    Ran Out of Food in the Last Year: Never true  Transportation  Needs: No Transportation Needs (12/01/2022)   PRAPARE - Hydrologist (Medical): No    Lack of Transportation (Non-Medical): No  Physical Activity: Sufficiently Active (12/01/2022)   Exercise Vital Sign    Days of Exercise per Week: 5 days    Minutes of Exercise per Session: 30 min  Stress: No Stress Concern Present (12/01/2022)   [place]    Feeling of Stress : Not at all  Social Connections: Moderately Integrated (12/01/2022)   Social Connection and Isolation Panel [NHANES]    Frequency of Communication with Friends and Family: More than three times a week    Frequency of Social Gatherings with Friends and Family: Three times a week    Attends Religious Services: More than 4 times per year    Active Member of Clubs or Organizations: No    Attends Archivist Meetings: Never     Marital Status: Married   Review of Systems CONSTITUTIONAL: Negative for chills, fatigue, fever, unintentional weight gain and unintentional weight loss.  E/N/T: Negative for ear pain, nasal congestion and sore throat.  CARDIOVASCULAR: Negative for chest pain, dizziness, palpitations and pedal edema.  RESPIRATORY: Negative for recent cough and dyspnea.  GASTROINTESTINAL: Negative for abdominal pain, acid reflux symptoms, constipation, diarrhea, nausea and vomiting.  GU - denies hematuria, urgency, dysuria MSK: Negative for arthralgias and myalgias.  INTEGUMENTARY: Negative for rash.  NEUROLOGICAL: Negative for dizziness and headaches.  PSYCHIATRIC: Negative for sleep disturbance and to question depression screen.  Negative for depression, negative for anhedonia.       Objective:  PHYSICAL EXAM:   VS: BP 120/70 (BP Location: Left Arm, Patient Position: Sitting, Cuff Size: Large)   Pulse 76   Temp (!) 97 F (36.1 C) (Temporal)   Ht 5' 7.5" (1.715 m)   Wt 197 lb 12.8 oz (89.7 kg)   SpO2 96%   BMI 30.52 kg/m   GEN: Well nourished, well developed, in no acute distress  HEENT: normal external ears and nose - normal external auditory canals and TMS - hearing grossly normal -  - Lips, Teeth and Gums - normal  Oropharynx - normal mucosa, palate, and posterior pharynx Neck: no JVD or masses - no thyromegaly Cardiac: RRR; no murmurs, rubs, or gallops,no edema - no significant varicosities Respiratory:  normal respiratory rate and pattern with no distress - normal breath sounds with no rales, rhonchi, wheezes or rubs GI: normal bowel sounds, no masses or tenderness MS: no deformity or atrophy  Skin: warm and dry, no rash  Neuro:  Alert and Oriented x 3, Strength and sensation are intact - CN II-Xii grossly intact Psych: euthymic mood, appropriate affect and demeanor  Office Visit on 12/01/2022  Component Date Value Ref Range Status   Color, UA 12/01/2022 yellow  yellow Final    Clarity, UA 12/01/2022 clear  clear Final   Glucose, UA 12/01/2022 negative  negative mg/dL Final   Bilirubin, UA 12/01/2022 negative  negative Final   Ketones, POC UA 12/01/2022 negative  negative mg/dL Final   Spec Grav, UA 12/01/2022 1.010  1.010 - 1.025 Final   Blood, UA 12/01/2022 trace-intact (A)  negative Final   pH, UA 12/01/2022 8.5 (A)  5.0 - 8.0 Final   POC PROTEIN,UA 12/01/2022 trace  negative, trace Final   Urobilinogen, UA 12/01/2022 0.2  0.2 or 1.0 E.U./dL Final   Nitrite, UA 12/01/2022 Negative  Negative Final   Leukocytes, UA 12/01/2022  Trace (A)  Negative Final    Lab Results  Component Value Date   WBC 4.6 11/17/2021   HGB 16.0 11/17/2021   HCT 47.0 11/17/2021   PLT 253 11/17/2021   GLUCOSE 102 (H) 11/17/2021   CHOL 136 11/17/2021   TRIG 55 11/17/2021   HDL 45 11/17/2021   LDLCALC 79 11/17/2021   ALT 48 (H) 11/17/2021   AST 36 11/17/2021   NA 137 11/17/2021   K 4.8 11/17/2021   CL 99 11/17/2021   CREATININE 1.03 11/17/2021   BUN 20 11/17/2021   CO2 26 11/17/2021   TSH 1.370 11/17/2021      Assessment & Plan:  Routine physical examination -     POCT URINALYSIS DIP (CLINITEK) -     CBC with Differential/Platelet -     Comprehensive metabolic panel -     TSH -     Lipid panel -     Hemoglobin A1c -     PSA  Hyperglycemia -     Hemoglobin A1c  Microscopic hematuria -     POCT URINALYSIS DIP (CLINITEK) -     Urine Culture  Benign prostatic hyperplasia without lower urinary tract symptoms -     PSA -     Testosterone,Free and Total     This is a list of the screening recommended for you and due dates:  Health Maintenance  Topic Date Due   COVID-19 Vaccine (4 - 2023-24 season) 12/17/2022*   Flu Shot  04/01/2023   Colon Cancer Screening  06/01/2023   DTaP/Tdap/Td vaccine (2 - Td or Tdap) 11/06/2029   Zoster (Shingles) Vaccine  Completed   HPV Vaccine  Aged Out   Hepatitis C Screening: USPSTF Recommendation to screen - Ages 65-79 yo.   Discontinued   HIV Screening  Discontinued  *Topic was postponed. The date shown is not the original due date.     No orders of the defined types were placed in this encounter.    Follow-up: Return in about 6 months (around 06/02/2023) for chronic fasting follow up - 3 weeks nurse visit recheck ua.  An After Visit Summary was printed and given to the patient.  Yetta Flock [facility] [phone]

## 2022-12-02 ENCOUNTER — Other Ambulatory Visit: Payer: Self-pay | Admitting: Physician Assistant

## 2022-12-02 ENCOUNTER — Encounter: Payer: BC Managed Care – PPO | Admitting: Physician Assistant

## 2022-12-02 DIAGNOSIS — E7849 Other hyperlipidemia: Secondary | ICD-10-CM

## 2022-12-02 LAB — CBC WITH DIFFERENTIAL/PLATELET
Basophils Absolute: 0 10*3/uL (ref 0.0–0.2)
Basos: 0 %
EOS (ABSOLUTE): 0.1 10*3/uL (ref 0.0–0.4)
Eos: 3 %
Hematocrit: 48 % (ref 37.5–51.0)
Hemoglobin: 16.1 g/dL (ref 13.0–17.7)
Immature Grans (Abs): 0 10*3/uL (ref 0.0–0.1)
Immature Granulocytes: 0 %
Lymphocytes Absolute: 1.3 10*3/uL (ref 0.7–3.1)
Lymphs: 31 %
MCH: 29.8 pg (ref 26.6–33.0)
MCHC: 33.5 g/dL (ref 31.5–35.7)
MCV: 89 fL (ref 79–97)
Monocytes Absolute: 0.2 10*3/uL (ref 0.1–0.9)
Monocytes: 5 %
Neutrophils Absolute: 2.6 10*3/uL (ref 1.4–7.0)
Neutrophils: 61 %
Platelets: 252 10*3/uL (ref 150–450)
RBC: 5.4 x10E6/uL (ref 4.14–5.80)
RDW: 11.5 % — ABNORMAL LOW (ref 11.6–15.4)
WBC: 4.2 10*3/uL (ref 3.4–10.8)

## 2022-12-02 LAB — COMPREHENSIVE METABOLIC PANEL
ALT: 20 IU/L (ref 0–44)
AST: 26 IU/L (ref 0–40)
Albumin/Globulin Ratio: 1.7 (ref 1.2–2.2)
Albumin: 4.4 g/dL (ref 3.9–4.9)
Alkaline Phosphatase: 69 IU/L (ref 44–121)
BUN/Creatinine Ratio: 16 (ref 10–24)
BUN: 15 mg/dL (ref 8–27)
Bilirubin Total: 0.8 mg/dL (ref 0.0–1.2)
CO2: 23 mmol/L (ref 20–29)
Calcium: 9.6 mg/dL (ref 8.6–10.2)
Chloride: 98 mmol/L (ref 96–106)
Creatinine, Ser: 0.92 mg/dL (ref 0.76–1.27)
Globulin, Total: 2.6 g/dL (ref 1.5–4.5)
Glucose: 100 mg/dL — ABNORMAL HIGH (ref 70–99)
Potassium: 4.7 mmol/L (ref 3.5–5.2)
Sodium: 139 mmol/L (ref 134–144)
Total Protein: 7 g/dL (ref 6.0–8.5)
eGFR: 93 mL/min/{1.73_m2} (ref >59)

## 2022-12-02 LAB — PSA: Prostate Specific Ag, Serum: 0.9 ng/mL (ref 0.0–4.0)

## 2022-12-02 LAB — HEMOGLOBIN A1C
Est. average glucose Bld gHb Est-mCnc: 123 mg/dL
Hgb A1c MFr Bld: 5.9 % — ABNORMAL HIGH (ref 4.8–5.6)

## 2022-12-02 LAB — TESTOSTERONE,FREE AND TOTAL
Testosterone, Free: 5.6 pg/mL — ABNORMAL LOW (ref 6.6–18.1)
Testosterone: 436 ng/dL (ref 264–916)

## 2022-12-02 LAB — LIPID PANEL
Chol/HDL Ratio: 2.6 ratio (ref 0.0–5.0)
Cholesterol, Total: 158 mg/dL (ref 100–199)
HDL: 60 mg/dL (ref >39)
LDL Chol Calc (NIH): 84 mg/dL (ref 0–99)
Triglycerides: 71 mg/dL (ref 0–149)
VLDL Cholesterol Cal: 14 mg/dL (ref 5–40)

## 2022-12-02 LAB — CARDIOVASCULAR RISK ASSESSMENT

## 2022-12-02 LAB — TSH: TSH: 1.24 u[IU]/mL (ref 0.450–4.500)

## 2022-12-02 MED ORDER — ROSUVASTATIN CALCIUM 5 MG PO TABS: 5.0000 mg | ORAL_TABLET | Freq: Every day | ORAL | 1 refills | Status: DC

## 2022-12-03 LAB — URINE CULTURE: Organism ID, Bacteria: NO GROWTH

## 2022-12-23 ENCOUNTER — Other Ambulatory Visit: Payer: Self-pay | Admitting: Physician Assistant

## 2022-12-23 ENCOUNTER — Ambulatory Visit (INDEPENDENT_AMBULATORY_CARE_PROVIDER_SITE_OTHER): Payer: BC Managed Care – PPO

## 2022-12-23 DIAGNOSIS — R3129 Other microscopic hematuria: Secondary | ICD-10-CM | POA: Diagnosis not present

## 2022-12-23 DIAGNOSIS — R899 Unspecified abnormal finding in specimens from other organs, systems and tissues: Secondary | ICD-10-CM

## 2022-12-23 LAB — POCT URINALYSIS DIP (CLINITEK)
Bilirubin, UA: NEGATIVE
Blood, UA: NEGATIVE
Glucose, UA: NEGATIVE mg/dL
Ketones, POC UA: NEGATIVE mg/dL
Leukocytes, UA: NEGATIVE
Nitrite, UA: NEGATIVE
Spec Grav, UA: 1.015 (ref 1.010–1.025)
Urobilinogen, UA: NEGATIVE E.U./dL — AB
pH, UA: 6.5 (ref 5.0–8.0)

## 2022-12-23 NOTE — Progress Notes (Signed)
Patient in today for a follow-up UA per Kennon Rounds.  Component     Latest Ref Rng 12/01/2022 12/23/2022  Color, UA     yellow  yellow  yellow   Clarity, UA     clear  clear  clear   Glucose     negative mg/dL negative  negative   Bilirubin, UA     negative  negative  negative   Ketones, UA     negative mg/dL negative  negative   Specific Gravity, UA     1.010 - 1.025  1.010  1.015   RBC, UA     negative  trace-intact !  negative   pH, UA     5.0 - 8.0  8.5 !  6.5   POC PROTEIN,UA     negative, trace  trace  trace   Urobilinogen, UA     0.2 or 1.0 E.U./dL 0.2  negative !   Nitrite, UA     Negative  Negative  Negative   Leukocytes,UA     Negative  Trace !  Negative

## 2023-01-01 ENCOUNTER — Other Ambulatory Visit (INDEPENDENT_AMBULATORY_CARE_PROVIDER_SITE_OTHER): Payer: BC Managed Care – PPO

## 2023-01-01 DIAGNOSIS — R899 Unspecified abnormal finding in specimens from other organs, systems and tissues: Secondary | ICD-10-CM | POA: Diagnosis not present

## 2023-01-01 LAB — POCT URINALYSIS DIP (CLINITEK)
Bilirubin, UA: NEGATIVE
Blood, UA: NEGATIVE
Glucose, UA: NEGATIVE mg/dL
Ketones, POC UA: NEGATIVE mg/dL
Leukocytes, UA: NEGATIVE
Nitrite, UA: NEGATIVE
POC PROTEIN,UA: NEGATIVE
Spec Grav, UA: 1.015 (ref 1.010–1.025)
Urobilinogen, UA: 0.2 E.U./dL
pH, UA: 7 (ref 5.0–8.0)

## 2023-01-01 LAB — COMPREHENSIVE METABOLIC PANEL
ALT: 17 IU/L (ref 0–44)
AST: 21 IU/L (ref 0–40)
Albumin/Globulin Ratio: 1.7 (ref 1.2–2.2)
Albumin: 4.3 g/dL (ref 3.9–4.9)
Alkaline Phosphatase: 69 IU/L (ref 44–121)
BUN/Creatinine Ratio: 18 (ref 10–24)
BUN: 19 mg/dL (ref 8–27)
Bilirubin Total: 0.7 mg/dL (ref 0.0–1.2)
CO2: 23 mmol/L (ref 20–29)
Calcium: 9.2 mg/dL (ref 8.6–10.2)
Chloride: 100 mmol/L (ref 96–106)
Creatinine, Ser: 1.06 mg/dL (ref 0.76–1.27)
Globulin, Total: 2.6 g/dL (ref 1.5–4.5)
Glucose: 88 mg/dL (ref 70–99)
Potassium: 5.1 mmol/L (ref 3.5–5.2)
Sodium: 138 mmol/L (ref 134–144)
Total Protein: 6.9 g/dL (ref 6.0–8.5)
eGFR: 79 mL/min/{1.73_m2} (ref >59)

## 2023-01-12 ENCOUNTER — Other Ambulatory Visit: Payer: Self-pay | Admitting: Physician Assistant

## 2023-03-10 DIAGNOSIS — N400 Enlarged prostate without lower urinary tract symptoms: Secondary | ICD-10-CM | POA: Insufficient documentation

## 2023-03-10 DIAGNOSIS — R3129 Other microscopic hematuria: Secondary | ICD-10-CM | POA: Insufficient documentation

## 2023-03-10 DIAGNOSIS — R739 Hyperglycemia, unspecified: Secondary | ICD-10-CM | POA: Insufficient documentation

## 2023-03-10 DIAGNOSIS — E291 Testicular hypofunction: Secondary | ICD-10-CM | POA: Insufficient documentation

## 2023-03-13 IMAGING — MR MR PROSTATE WO/W CM
12 series · 48 of 48 positions shown · IV contrast (multihance)
Comparison: None.

CLINICAL DATA: Elevated PSA (6.2), negative biopsy 2911

EXAM:
MR PROSTATE WITHOUT AND WITH CONTRAST
TECHNIQUE: Multiplanar multisequence MRI images were obtained of the pelvis
centered about the prostate. Pre and post contrast images were
obtained.
CONTRAST:  18mL MULTIHANCE GADOBENATE DIMEGLUMINE 529 MG/ML IV SOLN

[Series 3: T2 · coronal · 3.0mm · 0.56mm/px · 1 of 23 slices shown (1 of 3)]
[im 1/23]
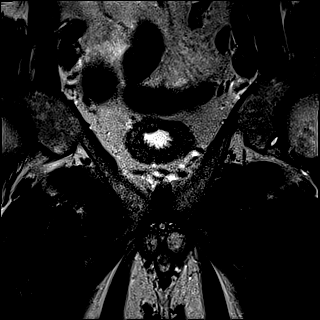

[Series 4: T1 · axial · 5.0mm · 1.25mm/px · z∈[-65,+170]mm · 2 of 96 slices shown]
[im 1/96]
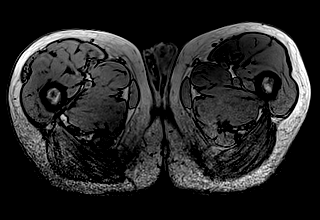
[im 96/96]
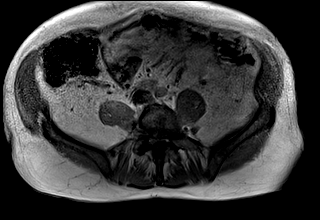

[Series 5: DWI · axial · 3.0mm · 1.75mm/px · 1 of 72 slices shown (1 of 3)]
[im 1/72]
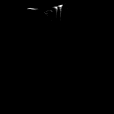

[Series 6: DWI · axial · 3.0mm · 1.75mm/px · 1 of 24 slices shown (2 of 3)]
[im 1/24]
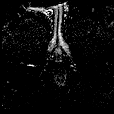

[Series 7: DWI · axial · 3.0mm · 1.75mm/px · 1 of 24 slices shown (3 of 3)]
[im 1/24]
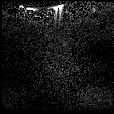

[Series 8: T2 · axial · 3.0mm · 0.56mm/px · 1 of 23 slices shown (2 of 3)]
[im 1/23]
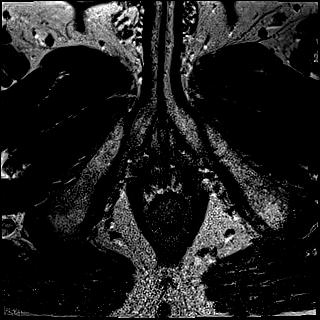

[Series 9: T2 · axial · 1.0mm · 1.04mm/px · 1 of 80 slices shown (3 of 3)]
[im 1/80]
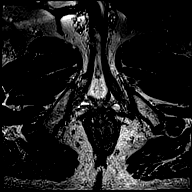

[Series 10: pre t1_twist_tra_dyn · axial · non-contrast · 3.5mm · 0.83mm/px · 1 of 20 slices shown]
[im 1/20]
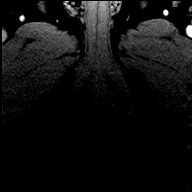

[Series 11: post t1_twist_tra_dyn-copy center · axial · non-contrast · 3.5mm · 0.83mm/px · z∈[-22,+44]mm · 17 of 600 slices shown]
[im 1/600]
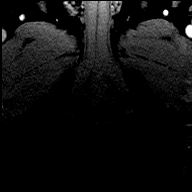
[im 38/600]
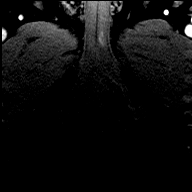
[im 75/600]
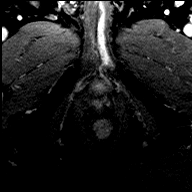
[im 113/600]
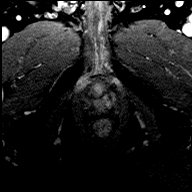
[im 150/600]
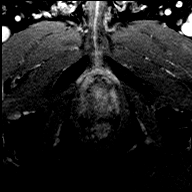
[im 188/600]
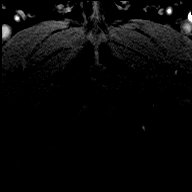
[im 225/600]
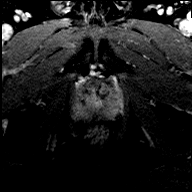
[im 263/600]
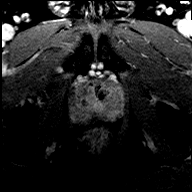
[im 300/600]
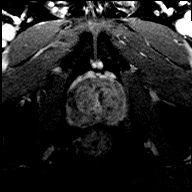
[im 337/600]
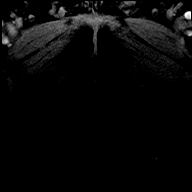
[im 375/600]
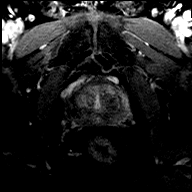
[im 412/600]
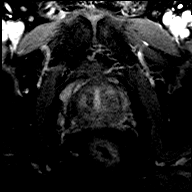
[im 450/600]
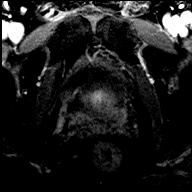
[im 487/600]
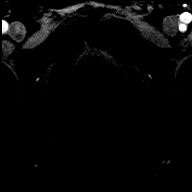
[im 525/600]
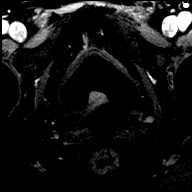
[im 562/600]
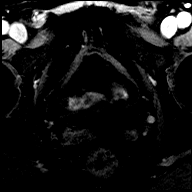
[im 600/600]
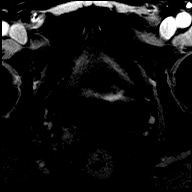

[Series 12: post t1_twist_tra_dyn-copy cent_sub · axial · 3.5mm · 0.83mm/px · z∈[-22,+44]mm · 16 of 580 slices shown]
[im 1/580]
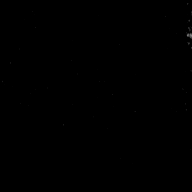
[im 39/580]
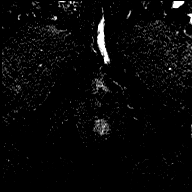
[im 78/580]
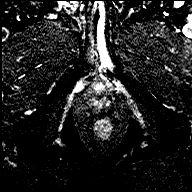
[im 116/580]
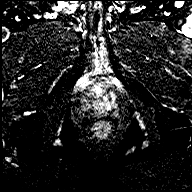
[im 155/580]
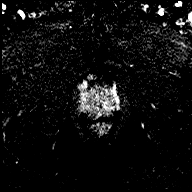
[im 194/580]
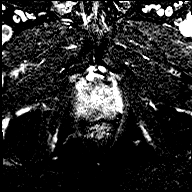
[im 232/580]
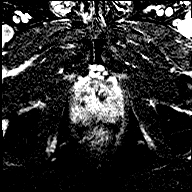
[im 271/580]
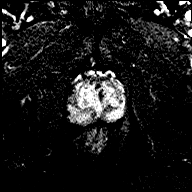
[im 309/580]
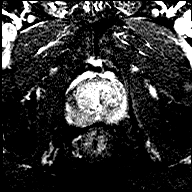
[im 348/580]
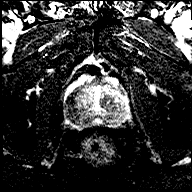
[im 387/580]
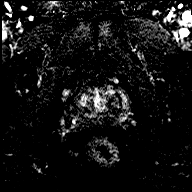
[im 425/580]
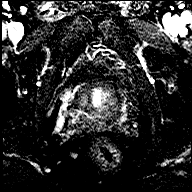
[im 464/580]
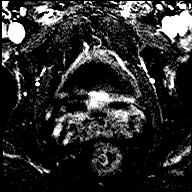
[im 502/580]
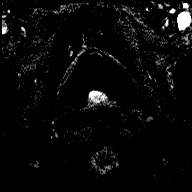
[im 541/580]
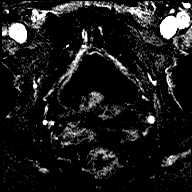
[im 580/580]
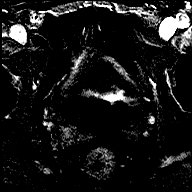

[Series 13: t1_vibe_dixon_tra_f · axial · 2.5mm · 1.14mm/px · z∈[-66,+171]mm · 3 of 96 slices shown]
[im 1/96]
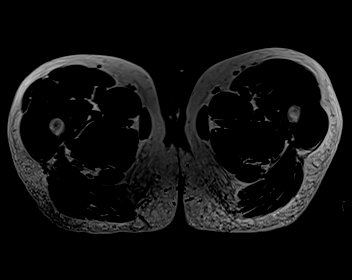
[im 48/96]
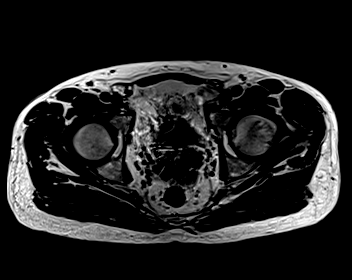
[im 96/96]
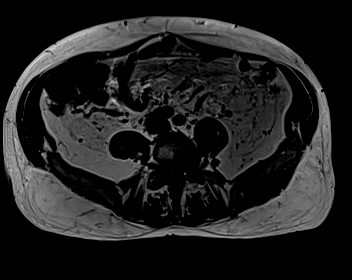

[Series 14: t1_vibe_dixon_tra_w · axial · 2.5mm · 1.14mm/px · z∈[-66,+171]mm · 3 of 96 slices shown]
[im 1/96]
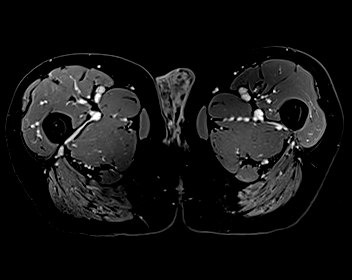
[im 48/96]
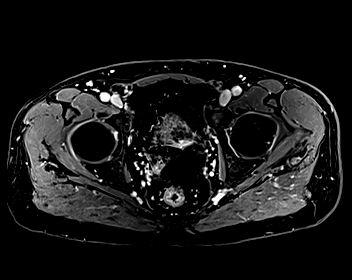
[im 96/96]
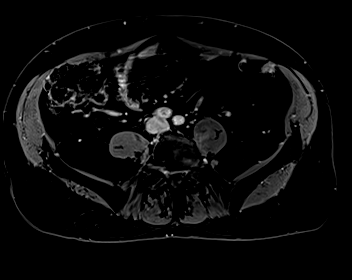

[48 of 48 positions shown; findings below may reference images not displayed]

FINDINGS: Prostate: 14 x 9 x 10 mm focal low T2 lesion in the left
posterolateral mid gland (series 8/image 13). Associated generalized
early arterial enhancement in the mid peripheral zone bilaterally
(series 11/image 171). Very mild restricted diffusion/low ADC is
possible (series [DATE]/image 13), although this is equivocal. PI-RADS
3.

Mild nodularity of the central gland. No suspicious central gland
nodule on T2.

Volume: 5.6 x 4.0 x 4.7 cm (calculated volume 52.5 mL)

Transcapsular spread:  Absent

Seminal vesicle involvement: Absent

Neurovascular bundle involvement: Absent

Pelvic adenopathy: Absent

Bone metastasis: Absent

Other findings: None
IMPRESSION: 14 mm lesion in the left posterolateral mid peripheral zone,
indeterminate but raising the possibility macroscopic prostate
cancer. PI-RADS 3. This lesion was marked in DynaCAD 3D software for
potential UroNAV biopsy.

No evidence of extracapsular extension, seminal vesicle invasion,
lymphadenopathy, or metastatic disease.

Calculated prostate volume 52.5 mL.

## 2023-05-04 ENCOUNTER — Other Ambulatory Visit: Payer: Self-pay | Admitting: Family Medicine

## 2023-06-30 ENCOUNTER — Other Ambulatory Visit: Payer: Self-pay | Admitting: Family Medicine

## 2023-08-17 ENCOUNTER — Other Ambulatory Visit: Payer: Self-pay | Admitting: Physician Assistant

## 2023-08-30 ENCOUNTER — Other Ambulatory Visit: Payer: Self-pay | Admitting: Family Medicine

## 2023-08-30 DIAGNOSIS — E7849 Other hyperlipidemia: Secondary | ICD-10-CM

## 2023-10-11 ENCOUNTER — Other Ambulatory Visit: Payer: Self-pay | Admitting: Physician Assistant

## 2023-11-29 ENCOUNTER — Telehealth: Payer: Self-pay

## 2023-11-29 NOTE — Telephone Encounter (Deleted)
 I left a message on the number(s) listed in the patients chart requesting the patient to call back regarding the upcomming appointment for 12/08/2023. The provider is out of the office that day. The appointment has been canceled. Waiting for the patient to return the call.  NOTE: If the patient does not call back within a week to reschedule this appointment, the front staff will mail the patient a letter requesting to call the office back.

## 2023-11-29 NOTE — Telephone Encounter (Signed)
 I left a message on the number(s) listed in the patients chart requesting the patient to call back regarding the upcomming appointment for 12/08/2023. The provider is out of the office that day. The appointment has been canceled. Waiting for the patient to return the call.   NOTE: please try to call the patient again if he does not call back to reschedule this appointment.

## 2023-11-30 ENCOUNTER — Other Ambulatory Visit: Payer: Self-pay | Admitting: Physician Assistant

## 2023-11-30 DIAGNOSIS — E7849 Other hyperlipidemia: Secondary | ICD-10-CM

## 2023-12-06 NOTE — Telephone Encounter (Signed)
 Looks like the appointment has been rescheduled.

## 2023-12-08 ENCOUNTER — Encounter: Payer: BC Managed Care – PPO | Admitting: Physician Assistant

## 2024-01-11 ENCOUNTER — Ambulatory Visit (INDEPENDENT_AMBULATORY_CARE_PROVIDER_SITE_OTHER): Admitting: Physician Assistant

## 2024-01-11 ENCOUNTER — Encounter: Payer: Self-pay | Admitting: Physician Assistant

## 2024-01-11 VITALS — BP 124/70 | HR 68 | Temp 98.3°F | Resp 18 | Ht 67.5 in | Wt 196.2 lb

## 2024-01-11 DIAGNOSIS — R7303 Prediabetes: Secondary | ICD-10-CM | POA: Diagnosis not present

## 2024-01-11 DIAGNOSIS — Z0000 Encounter for general adult medical examination without abnormal findings: Secondary | ICD-10-CM | POA: Diagnosis not present

## 2024-01-11 DIAGNOSIS — N400 Enlarged prostate without lower urinary tract symptoms: Secondary | ICD-10-CM | POA: Diagnosis not present

## 2024-01-11 DIAGNOSIS — Z1211 Encounter for screening for malignant neoplasm of colon: Secondary | ICD-10-CM | POA: Diagnosis not present

## 2024-01-11 LAB — POCT URINALYSIS DIP (CLINITEK)
Bilirubin, UA: NEGATIVE
Blood, UA: NEGATIVE
Glucose, UA: NEGATIVE mg/dL
Ketones, POC UA: NEGATIVE mg/dL
Leukocytes, UA: NEGATIVE
Nitrite, UA: NEGATIVE
POC PROTEIN,UA: NEGATIVE
Spec Grav, UA: 1.025 (ref 1.010–1.025)
Urobilinogen, UA: 0.2 U/dL
pH, UA: 5.5 (ref 5.0–8.0)

## 2024-01-11 MED ORDER — ALPRAZOLAM 0.5 MG PO TABS: 0.5000 mg | ORAL_TABLET | Freq: Two times a day (BID) | ORAL | 1 refills | Status: DC

## 2024-01-11 NOTE — Progress Notes (Signed)
 Subjective:  Patient ID: Dustin Horn, male    DOB: March 25, 1961  Age: 63 y.o. MRN: [id number]  Chief Complaint  Patient presents with   Annual Exam    HPI Well Adult Physical: Patient here for a comprehensive physical exam.The patient reports no problems Do you take any herbs or supplements that were not prescribed by a doctor? no Are you taking calcium  supplements? no Are you taking aspirin daily? no  Encounter for general adult medical examination without abnormal findings  Physical ("At Risk" items are starred): Patient's last physical exam was 1 year ago .  Patient is not afflicted from Stress Incontinence and Urge Incontinence  Patient wears a seat belt, has smoke detectors, has carbon monoxide detectors, practices appropriate gun safety, and wears sunscreen with extended sun exposure. Dental Care: brushes and flosses daily. Last dental visit: up to date Vision impairments:wears glasses Ophthalmology/Optometry: Annual visit.  Hearing loss: none Last PSA: is due- follows with urologist Dr Antonetta Kitchen Lab Results  Component Value Date   PSA1 0.9 12/01/2022   PSA1 1.4 11/17/2021   PSA1 6.2 (H) 11/13/2020   Self Testicular Exam: Yes     01/11/2024    1:32 PM 12/01/2022    8:45 AM 11/17/2021    8:16 AM 11/13/2020    8:30 AM  Depression screen PHQ 2/9  Decreased Interest 0 0 0 0  Down, Depressed, Hopeless 0 0 0 0  PHQ - 2 Score 0 0 0 0  Altered sleeping  0    Tired, decreased energy  0    Change in appetite  0    Feeling bad or failure about yourself   0    Trouble concentrating  0    Moving slowly or fidgety/restless  0    Suicidal thoughts  0    PHQ-9 Score  0    Difficult doing work/chores  Not difficult at all           11/17/2021    8:16 AM 12/01/2022    8:42 AM 01/11/2024    1:32 PM  Fall Risk  Falls in the past year? 0 0 0  Was there an injury with Fall? 0 0 0  Fall Risk Category Calculator 0 0 0  Fall Risk Category (Retired) Low    (RETIRED) Patient Fall Risk  Level Low fall risk    Patient at Risk for Falls Due to No Fall Risks No Fall Risks No Fall Risks  Fall risk Follow up Falls evaluation completed Falls evaluation completed Falls evaluation completed             Social Hx   Social History   Socioeconomic History   Marital status: Married    Spouse name: Not on file   Number of children: 3   Years of education: Not on file   Highest education level: Associate degree: occupational, Scientist, product/process development, or vocational program  Occupational History   Occupation: [employer]  Tobacco Use   Smoking status: Never   Smokeless tobacco: Never  Vaping Use   Vaping status: Never Used  Substance and Sexual Activity   Alcohol use: Yes    Alcohol/week: 3.0 standard drinks of alcohol    Types: 3 Glasses of wine per week   Drug use: Never   Sexual activity: Yes    Birth control/protection: Surgical  Other Topics Concern   Not on file  Social History Narrative   Not on file   Social Drivers of Health   Financial Resource Strain:  Low Risk  (01/10/2024)   Overall Financial Resource Strain (CARDIA)    Difficulty of Paying Living Expenses: Not very hard  Food Insecurity: No Food Insecurity (01/10/2024)   Hunger Vital Sign    Worried About Running Out of Food in the Last Year: Never true    Ran Out of Food in the Last Year: Never true  Transportation Needs: No Transportation Needs (01/10/2024)   PRAPARE - Administrator, Civil Service (Medical): No    Lack of Transportation (Non-Medical): No  Physical Activity: Insufficiently Active (01/10/2024)   Exercise Vital Sign    Days of Exercise per Week: 2 days    Minutes of Exercise per Session: 30 min  Stress: Stress Concern Present (01/10/2024)   [employer] of Occupational Health - Occupational Stress Questionnaire    Feeling of Stress : To some extent  Social Connections: Moderately Integrated (01/10/2024)   Social Connection and Isolation Panel [NHANES]    Frequency of Communication with  Friends and Family: More than three times a week    Frequency of Social Gatherings with Friends and Family: Twice a week    Attends Religious Services: More than 4 times per year    Active Member of [employer] or Organizations: No    Attends Engineer, structural: Not on file    Marital Status: Married   Past Medical History:  Diagnosis Date   Allergy    Anxiety    BPH (benign prostatic hyperplasia)    Past Surgical History:  Procedure Laterality Date   HERNIA REPAIR      Family History  Problem Relation Age of Onset   Cancer Mother    Heart disease Mother    Diabetes Father    Cancer Maternal Grandfather    Arthritis Maternal Grandmother    ADD / ADHD Daughter    Intellectual disability Daughter    Cancer Maternal Uncle     ROS CONSTITUTIONAL: Negative for chills, fatigue, fever, unintentional weight gain and unintentional weight loss.  E/N/T: Negative for ear pain, nasal congestion and sore throat.  CARDIOVASCULAR: Negative for chest pain, dizziness, palpitations and pedal edema.  RESPIRATORY: Negative for recent cough and dyspnea.  GASTROINTESTINAL: Negative for abdominal pain, acid reflux symptoms, constipation, diarrhea, nausea and vomiting.  MSK: Negative for arthralgias and myalgias.  INTEGUMENTARY: Negative for rash.  NEUROLOGICAL: Negative for dizziness and headaches.  PSYCHIATRIC: Negative for sleep disturbance and to question depression screen.  Negative for depression, negative for anhedonia.   Objective:  PHYSICAL EXAM:   BP 124/70   Pulse 68   Temp 98.3 F (36.8 C) (Temporal)   Resp 18   Ht 5' 7.5" (1.715 m)   Wt 196 lb 3.2 oz (89 kg)   SpO2 98%   BMI 30.28 kg/m  BP Readings from Last 3 Encounters:  01/11/24 124/70  12/01/22 120/70  11/17/21 128/68     Vision Screening   Right eye Left eye Both eyes  Without correction     With correction 20/20 20/20 20/20      GEN: Well nourished, well developed, in no acute distress  HEENT: normal  external ears and nose - normal external auditory canals and TMS - hearing grossly normal -  - Lips, Teeth and Gums - normal  Oropharynx - normal mucosa, palate, and posterior pharynx Neck: no JVD or masses - no thyromegaly Cardiac: RRR; no murmurs, rubs, or gallops,no edema - no significant varicosities Respiratory:  normal respiratory rate and pattern with no distress -  normal breath sounds with no rales, rhonchi, wheezes or rubs GI: normal bowel sounds, no masses or tenderness MS: no deformity or atrophy  Skin: warm and dry, no rash  Neuro:  Alert and Oriented x 3, - CN II-Xii grossly intact Psych: euthymic mood, appropriate affect and demeanor Office Visit on 01/11/2024  Component Date Value Ref Range Status   Color, UA 01/11/2024 yellow  yellow Final   Clarity, UA 01/11/2024 clear  clear Final   Glucose, UA 01/11/2024 negative  negative mg/dL Final   Bilirubin, UA 40/98/1191 negative  negative Final   Ketones, POC UA 01/11/2024 negative  negative mg/dL Final   Spec Grav, UA 47/82/9562 1.025  1.010 - 1.025 Final   Blood, UA 01/11/2024 negative  negative Final   pH, UA 01/11/2024 5.5  5.0 - 8.0 Final   POC PROTEIN,UA 01/11/2024 negative  negative, trace Final   Urobilinogen, UA 01/11/2024 0.2  0.2 or 1.0 E.U./dL Final   Nitrite, UA 13/03/6577 Negative  Negative Final   Leukocytes, UA 01/11/2024 Negative  Negative Final    Assessment & Plan:  Annual physical exam -     POCT URINALYSIS DIP (CLINITEK) -     CBC with Differential/Platelet -     Comprehensive metabolic panel with GFR -     TSH -     Lipid panel  Benign prostatic hyperplasia without lower urinary tract symptoms -     PSA  Prediabetes -     Hemoglobin A1c  Colon cancer screening -     Ambulatory referral to Gastroenterology  Other orders -     ALPRAZolam ; Take 1 tablet (0.5 mg total) by mouth 2 (two) times daily.  Dispense: 60 tablet; Refill: 1    This is a list of the screening recommended for you and due  dates:  Health Maintenance  Topic Date Due   Colon Cancer Screening  06/01/2023   Flu Shot  03/31/2024   DTaP/Tdap/Td vaccine (2 - Td or Tdap) 11/06/2029   COVID-19 Vaccine  Completed   Zoster (Shingles) Vaccine  Completed   HPV Vaccine  Aged Out   Meningitis B Vaccine  Aged Out   Hepatitis C Screening  Discontinued   HIV Screening  Discontinued     Follow-up: Return in about 1 year (around 01/10/2025) for fasting physical - 20 min.  An After Visit Summary was printed and given to the patient.  Anthonette Bastos [facility] [phone]

## 2024-01-12 ENCOUNTER — Ambulatory Visit: Payer: Self-pay | Admitting: Physician Assistant

## 2024-01-12 LAB — CBC WITH DIFFERENTIAL/PLATELET
Basophils Absolute: 0 10*3/uL (ref 0.0–0.2)
Basos: 1 %
EOS (ABSOLUTE): 0.1 10*3/uL (ref 0.0–0.4)
Eos: 3 %
Hematocrit: 47.8 % (ref 37.5–51.0)
Hemoglobin: 16.2 g/dL (ref 13.0–17.7)
Immature Grans (Abs): 0 10*3/uL (ref 0.0–0.1)
Immature Granulocytes: 0 %
Lymphocytes Absolute: 1.2 10*3/uL (ref 0.7–3.1)
Lymphs: 28 %
MCH: 31.5 pg (ref 26.6–33.0)
MCHC: 33.9 g/dL (ref 31.5–35.7)
MCV: 93 fL (ref 79–97)
Monocytes Absolute: 0.2 10*3/uL (ref 0.1–0.9)
Monocytes: 6 %
Neutrophils Absolute: 2.6 10*3/uL (ref 1.4–7.0)
Neutrophils: 62 %
Platelets: 225 10*3/uL (ref 150–450)
RBC: 5.15 x10E6/uL (ref 4.14–5.80)
RDW: 11.7 % (ref 11.6–15.4)
WBC: 4.2 10*3/uL (ref 3.4–10.8)

## 2024-01-12 LAB — COMPREHENSIVE METABOLIC PANEL WITH GFR
ALT: 13 IU/L (ref 0–44)
AST: 20 IU/L (ref 0–40)
Albumin: 4.3 g/dL (ref 3.9–4.9)
Alkaline Phosphatase: 81 IU/L (ref 44–121)
BUN/Creatinine Ratio: 20 (ref 10–24)
BUN: 20 mg/dL (ref 8–27)
Bilirubin Total: 0.4 mg/dL (ref 0.0–1.2)
CO2: 23 mmol/L (ref 20–29)
Calcium: 9.2 mg/dL (ref 8.6–10.2)
Chloride: 104 mmol/L (ref 96–106)
Creatinine, Ser: 0.99 mg/dL (ref 0.76–1.27)
Globulin, Total: 2.4 g/dL (ref 1.5–4.5)
Glucose: 98 mg/dL (ref 70–99)
Potassium: 4.3 mmol/L (ref 3.5–5.2)
Sodium: 139 mmol/L (ref 134–144)
Total Protein: 6.7 g/dL (ref 6.0–8.5)
eGFR: 85 mL/min/{1.73_m2} (ref >59)

## 2024-01-12 LAB — LIPID PANEL
Chol/HDL Ratio: 2.3 ratio (ref 0.0–5.0)
Cholesterol, Total: 140 mg/dL (ref 100–199)
HDL: 62 mg/dL (ref >39)
LDL Chol Calc (NIH): 69 mg/dL (ref 0–99)
Triglycerides: 38 mg/dL (ref 0–149)
VLDL Cholesterol Cal: 9 mg/dL (ref 5–40)

## 2024-01-12 LAB — HEMOGLOBIN A1C
Est. average glucose Bld gHb Est-mCnc: 120 mg/dL
Hgb A1c MFr Bld: 5.8 % — ABNORMAL HIGH (ref 4.8–5.6)

## 2024-01-12 LAB — TSH: TSH: 0.978 u[IU]/mL (ref 0.450–4.500)

## 2024-01-12 LAB — PSA: Prostate Specific Ag, Serum: 1.1 ng/mL (ref 0.0–4.0)

## 2024-02-26 ENCOUNTER — Other Ambulatory Visit: Payer: Self-pay | Admitting: Physician Assistant

## 2024-02-26 DIAGNOSIS — E7849 Other hyperlipidemia: Secondary | ICD-10-CM

## 2024-03-09 ENCOUNTER — Encounter: Payer: Self-pay | Admitting: Gastroenterology

## 2024-04-16 ENCOUNTER — Other Ambulatory Visit: Payer: Self-pay | Admitting: Physician Assistant

## 2024-05-02 ENCOUNTER — Ambulatory Visit (AMBULATORY_SURGERY_CENTER): Admitting: *Deleted

## 2024-05-02 ENCOUNTER — Encounter: Payer: Self-pay | Admitting: Gastroenterology

## 2024-05-02 VITALS — Ht 69.0 in | Wt 193.0 lb

## 2024-05-02 DIAGNOSIS — Z1211 Encounter for screening for malignant neoplasm of colon: Secondary | ICD-10-CM

## 2024-05-02 MED ORDER — NA SULFATE-K SULFATE-MG SULF 17.5-3.13-1.6 GM/177ML PO SOLN: 1.0000 | Freq: Once | ORAL | 0 refills | Status: AC

## 2024-05-02 NOTE — Progress Notes (Signed)
 Pt's name and DOB verified at the beginning of the pre-visit with 2 identifiers  Pt denies any difficulty with ambulating,sitting, laying down or rolling side to side  Pt has no issues moving head neck or swallowing  No egg or soy allergy known to patient   No issues known to pt with past sedation  No FH of Malignant Hyperthermia  Pt is not on home 02   Pt is not on blood thinners   Pt denies issues with constipation   Pt is not on dialysis  Pt denise any abnormal heart rhythms   Pt denies any upcoming cardiac testing  Patient's chart reviewed by Norleen Schillings CNRA prior to pre-visit and patient appropriate for the [facility].  Pre-visit completed and red dot placed by patient's name on their procedure day (on provider's schedule).    Visit by phone  Pt states weight is 193 lb  Pt given  both [facility] main # and MD on call # prior to instructions.  Informed pt to come in at the time discussed and is shown on PV instructions.  Pt instructed to use Singlecare.com or GoodRx for a price reduction on prep  Instructed pt to review instructions again prior to procedure and call main # given if has any questions or any issues. Pt states they will. Instructed pt where to find PV instructions in My Chart  Instructed pt on all aspects of written instructions including med holds clothing to wear and foods to eat and not eat as well as after procedure legal restrictions and to call MD on call if needed.. Pt states understanding.

## 2024-05-16 ENCOUNTER — Encounter: Payer: Self-pay | Admitting: Gastroenterology

## 2024-05-16 ENCOUNTER — Ambulatory Visit: Admitting: Gastroenterology

## 2024-05-16 VITALS — BP 128/82 | HR 72 | Temp 98.4°F | Resp 18 | Ht 69.0 in | Wt 193.0 lb

## 2024-05-16 DIAGNOSIS — K640 First degree hemorrhoids: Secondary | ICD-10-CM | POA: Diagnosis not present

## 2024-05-16 DIAGNOSIS — D124 Benign neoplasm of descending colon: Secondary | ICD-10-CM

## 2024-05-16 DIAGNOSIS — Z1211 Encounter for screening for malignant neoplasm of colon: Secondary | ICD-10-CM | POA: Diagnosis present

## 2024-05-16 MED ORDER — SODIUM CHLORIDE 0.9 % IV SOLN: 500.0000 mL | INTRAVENOUS | Status: DC

## 2024-05-16 NOTE — Patient Instructions (Signed)
Handout on polyps and hemorrhoids provided.  Await pathology results.  YOU HAD AN ENDOSCOPIC PROCEDURE TODAY AT [facility]:   Refer to the procedure report that was given to you for any specific questions about what was found during the examination.  If the procedure report does not answer your questions, please call your gastroenterologist to clarify.  If you requested that your care partner not be given the details of your procedure findings, then the procedure report has been included in a sealed envelope for you to review at your convenience later.  YOU SHOULD EXPECT: Some feelings of bloating in the abdomen. Passage of more gas than usual.  Walking can help get rid of the air that was put into your GI tract during the procedure and reduce the bloating. If you had a lower endoscopy (such as a colonoscopy or flexible sigmoidoscopy) you may notice spotting of blood in your stool or on the toilet paper. If you underwent a bowel prep for your procedure, you may not have a normal bowel movement for a few days.  Please Note:  You might notice some irritation and congestion in your nose or some drainage.  This is from the oxygen used during your procedure.  There is no need for concern and it should clear up in a day or so.  SYMPTOMS TO REPORT IMMEDIATELY:  Following lower endoscopy (colonoscopy or flexible sigmoidoscopy):  Excessive amounts of blood in the stool  Significant tenderness or worsening of abdominal pains  Swelling of the abdomen that is new, acute  Fever of 100F or higher   For urgent or emergent issues, a gastroenterologist can be reached at any hour by calling [phone]. Do not use MyChart messaging for urgent concerns.    DIET:  We do recommend a small meal at first, but then you may proceed to your regular diet.  Drink plenty of fluids but you should avoid alcoholic beverages for 24 hours.  ACTIVITY:  You should plan to take it easy for the rest of  today and you should NOT DRIVE or use heavy machinery until tomorrow (because of the sedation medicines used during the test).    FOLLOW UP: Our staff will call the number listed on your records the next business day following your procedure.  We will call around 7:15- 8:00 am to check on you and address any questions or concerns that you may have regarding the information given to you following your procedure. If we do not reach you, we will leave a message.     If any biopsies were taken you will be contacted by phone or by letter within the next 1-3 weeks.  Please call us at [phone] if you have not heard about the biopsies in 3 weeks.    SIGNATURES/CONFIDENTIALITY: You and/or your care partner have signed paperwork which will be entered into your electronic medical record.  These signatures attest to the fact that that the information above on your After Visit Summary has been reviewed and is understood.  Full responsibility of the confidentiality of this discharge information lies with you and/or your care-partner.

## 2024-05-16 NOTE — Progress Notes (Signed)
 Called to room to assist during endoscopic procedure.  Patient ID and intended procedure confirmed with present staff. Received instructions for my participation in the procedure from the performing physician.

## 2024-05-16 NOTE — Progress Notes (Signed)
 [facility] History and Physical   Primary Care Physician:  Nicholaus Credit, PA-C   Reason for Procedure:   CRC screening  Plan:    colon     HPI: Dustin Horn is a 63 y.o. male    Past Medical History:  Diagnosis Date   Allergy    Anxiety    BPH (benign prostatic hyperplasia)     Past Surgical History:  Procedure Laterality Date   COLONOSCOPY     HERNIA REPAIR      Prior to Admission medications   Medication Sig Start Date End Date Taking? Authorizing Provider  ALPRAZolam  (XANAX ) 0.5 MG tablet Take 1 tablet by mouth twice daily 04/17/24  Yes Dustin Horn, Kirsten, MD  finasteride (PROSCAR) 5 MG tablet Take 5 mg by mouth daily. 10/26/21  Yes [provider]  loratadine (CLARITIN) 10 MG tablet Take 10 mg by mouth daily.   Yes [provider]  MYRBETRIQ 50 MG TB24 tablet Takes generic 05/07/23  Yes [provider]  rosuvastatin  (CRESTOR ) 5 MG tablet Take 1 tablet by mouth once daily 02/28/24  Yes Nicholaus Credit, PA-C  tamsulosin (FLOMAX) 0.4 MG CAPS capsule Take 0.4 mg by mouth daily. 08/01/19  Yes [provider]  sildenafil (REVATIO) 20 MG tablet Take 20 mg by mouth daily. Patient taking differently: Take 20 mg by mouth as needed.    [provider]    Current Outpatient Medications  Medication Sig Dispense Refill   ALPRAZolam  (XANAX ) 0.5 MG tablet Take 1 tablet by mouth twice daily 60 tablet 0   finasteride (PROSCAR) 5 MG tablet Take 5 mg by mouth daily.     loratadine (CLARITIN) 10 MG tablet Take 10 mg by mouth daily.     MYRBETRIQ 50 MG TB24 tablet Takes generic     rosuvastatin  (CRESTOR ) 5 MG tablet Take 1 tablet by mouth once daily 90 tablet 0   tamsulosin (FLOMAX) 0.4 MG CAPS capsule Take 0.4 mg by mouth daily.     sildenafil (REVATIO) 20 MG tablet Take 20 mg by mouth daily. (Patient taking differently: Take 20 mg by mouth as needed.)     Current Facility-Administered Medications  Medication Dose Route Frequency  Provider Last Rate Last Admin   0.9 %  sodium chloride  infusion  500 mL Intravenous Continuous Charlanne Groom, MD        Allergies as of 05/16/2024   (No Known Allergies)    Family History  Problem Relation Age of Onset   Cancer Mother    Heart disease Mother    Diabetes Father    Cancer Maternal Uncle    Arthritis Maternal Grandmother    Cancer Maternal Grandfather    ADD / ADHD Daughter    Intellectual disability Daughter    Colon polyps Neg Hx    Colon cancer Neg Hx    Esophageal cancer Neg Hx    Rectal cancer Neg Hx    Stomach cancer Neg Hx     Social History   Socioeconomic History   Marital status: Married    Spouse name: Not on file   Number of children: 3   Years of education: Not on file   Highest education level: Associate degree: occupational, Scientist, product/process development, or vocational program  Occupational History   Occupation: Education officer, community  Tobacco Use   Smoking status: Never   Smokeless tobacco: Never  Vaping Use   Vaping status: Never Used  Substance and Sexual Activity   Alcohol use: Yes    Alcohol/week: 3.0  standard drinks of alcohol    Types: 3 Glasses of wine per week   Drug use: Never   Sexual activity: Yes    Birth control/protection: Surgical  Other Topics Concern   Not on file  Social History Narrative   Not on file   Social Drivers of Health   Financial Resource Strain: Low Risk  (01/10/2024)   Overall Financial Resource Strain (CARDIA)    Difficulty of Paying Living Expenses: Not very hard  Food Insecurity: No Food Insecurity (01/10/2024)   Hunger Vital Sign    Worried About Running Out of Food in the Last Year: Never true    Ran Out of Food in the Last Year: Never true  Transportation Needs: No Transportation Needs (01/10/2024)   PRAPARE - Administrator, Civil Service (Medical): No    Lack of Transportation (Non-Medical): No  Physical Activity: Insufficiently Active (01/10/2024)   Exercise Vital Sign    Days of Exercise per Week: 2 days     Minutes of Exercise per Session: 30 min  Stress: Stress Concern Present (01/10/2024)   [employer] of Occupational Health - Occupational Stress Questionnaire    Feeling of Stress : To some extent  Social Connections: Moderately Integrated (01/10/2024)   Social Connection and Isolation Panel    Frequency of Communication with Friends and Family: More than three times a week    Frequency of Social Gatherings with Friends and Family: Twice a week    Attends Religious Services: More than 4 times per year    Active Member of [employer] or Organizations: No    Attends Engineer, structural: Not on file    Marital Status: Married  Catering manager Violence: Not At Risk (01/11/2024)   Humiliation, Afraid, Rape, and Kick questionnaire    Fear of Current or Ex-Partner: No    Emotionally Abused: No    Physically Abused: No    Sexually Abused: No    Review of Systems: Positive for none All other review of systems negative except as mentioned in the HPI.  Physical Exam: Vital signs in last 24 hours: @VSRANGES @   General:   Alert,  Well-developed, well-nourished, pleasant and cooperative in NAD Lungs:  Clear throughout to auscultation.   Heart:  Regular rate and rhythm; no murmurs, clicks, rubs,  or gallops. Abdomen:  Soft, nontender and nondistended. Normal bowel sounds.   Neuro/Psych:  Alert and cooperative. Normal mood and affect. A and O x 3    No significant changes were identified.  The patient continues to be an appropriate candidate for the planned procedure and anesthesia.   Anselm Bring, MD. [facility] 05/16/2024 1:22 PM@

## 2024-05-16 NOTE — Progress Notes (Signed)
 Sedate, gd SR, tolerated procedure well, VSS, report to RN

## 2024-05-16 NOTE — Progress Notes (Signed)
 Pt's states no medical or surgical changes since previsit or office visit.

## 2024-05-16 NOTE — Op Note (Signed)
 [facility] Patient Name: Dustin Horn Procedure Date: 05/16/2024 1:25 PM MRN: [id number] Endoscopist: Lynnie Bring , MD, [id number] Age: 63 Referring MD:  Date of Birth: 1961/05/11 Gender: Male Account #: [id number] Procedure:                Colonoscopy Indications:              Screening for colorectal malignant neoplasm Medicines:                Monitored Anesthesia Care Procedure:                Pre-Anesthesia Assessment:                           - Prior to the procedure, a History and Physical                            was performed, and patient medications and                            allergies were reviewed. The patient's tolerance of                            previous anesthesia was also reviewed. The risks                            and benefits of the procedure and the sedation                            options and risks were discussed with the patient.                            All questions were answered, and informed consent                            was obtained. Prior Anticoagulants: The patient has                            taken no anticoagulant or antiplatelet agents. ASA                            Grade Assessment: II - A patient with mild systemic                            disease. After reviewing the risks and benefits,                            the patient was deemed in satisfactory condition to                            undergo the procedure.                           After obtaining informed consent, the colonoscope  was passed under direct vision. Throughout the                            procedure, the patient's blood pressure, pulse, and                            oxygen saturations were monitored continuously. The                            CF [id number] #[id number] was introduced through the anus                            and advanced to the the cecum, identified by                            appendiceal orifice  and ileocecal valve. The                            colonoscopy was performed without difficulty. The                            patient tolerated the procedure well. The quality                            of the bowel preparation was good. The ileocecal                            valve, appendiceal orifice, and rectum were                            photographed. Scope In: 1:29:12 PM Scope Out: 1:46:19 PM Scope Withdrawal Time: 0 hours 10 minutes 32 seconds  Total Procedure Duration: 0 hours 17 minutes 7 seconds  Findings:                 A 6 mm polyp was found in the mid descending colon.                            The polyp was sessile. The polyp was removed with a                            cold snare. Resection and retrieval were complete.                           Non-bleeding internal hemorrhoids were found during                            retroflexion. The hemorrhoids were small and Grade                            I (internal hemorrhoids that do not prolapse).                           Retroflexion in the right colon was  performed.                           The exam was otherwise without abnormality on                            direct and retroflexion views. Complications:            No immediate complications. Estimated Blood Loss:     Estimated blood loss: none. Impression:               - One 6 mm polyp in the mid descending colon,                            removed with a cold snare. Resected and retrieved.                           - Non-bleeding internal hemorrhoids.                           - The examination was otherwise normal on direct                            and retroflexion views. Recommendation:           - Patient has a contact number available for                            emergencies. The signs and symptoms of potential                            delayed complications were discussed with the                            patient. Return to normal activities  tomorrow.                            Written discharge instructions were provided to the                            patient.                           - Resume previous diet.                           - Continue present medications.                           - Await pathology results.                           - Repeat colonoscopy for surveillance based on                            pathology results.                           -  The findings and recommendations were discussed                            with the patient's family Dustin Horn). Lynnie Bring, MD 05/16/2024 1:49:34 PM This report has been signed electronically.

## 2024-05-17 ENCOUNTER — Telehealth: Payer: Self-pay | Admitting: *Deleted

## 2024-05-17 NOTE — Telephone Encounter (Signed)
  Follow up Call-     05/16/2024   12:34 PM  Call back number  Post procedure Call Back phone  # [phone]  Permission to leave phone message Yes     Patient questions:  Do you have a fever, pain , or abdominal swelling? No. Pain Score  0 *  Have you tolerated food without any problems? Yes.    Have you been able to return to your normal activities? Yes.    Do you have any questions about your discharge instructions: Diet   No. Medications  No. Follow up visit  No.  Do you have questions or concerns about your Care? No.  Actions: * If pain score is 4 or above: No action needed, pain <4.

## 2024-05-19 LAB — SURGICAL PATHOLOGY

## 2024-05-21 ENCOUNTER — Ambulatory Visit: Payer: Self-pay | Admitting: Gastroenterology

## 2024-05-29 ENCOUNTER — Other Ambulatory Visit: Payer: Self-pay | Admitting: Physician Assistant

## 2024-05-29 DIAGNOSIS — E7849 Other hyperlipidemia: Secondary | ICD-10-CM

## 2024-06-11 ENCOUNTER — Other Ambulatory Visit: Payer: Self-pay | Admitting: Family Medicine

## 2024-08-07 ENCOUNTER — Other Ambulatory Visit: Payer: Self-pay | Admitting: Physician Assistant

## 2024-08-27 ENCOUNTER — Other Ambulatory Visit: Payer: Self-pay | Admitting: Family Medicine

## 2024-08-27 DIAGNOSIS — E7849 Other hyperlipidemia: Secondary | ICD-10-CM

## 2025-01-16 ENCOUNTER — Encounter: Admitting: Physician Assistant

## 2025-01-17 ENCOUNTER — Encounter: Admitting: Physician Assistant
